# Patient Record
Sex: Male | Born: 1950 | Race: White | Hispanic: No | Marital: Married | Smoking: Never smoker
Health system: Southern US, Community
[De-identification: ages and names within clinical notes are randomized; demographics above are authoritative.]

## PROBLEM LIST (undated history)

## (undated) DIAGNOSIS — I82409 Acute embolism and thrombosis of unspecified deep veins of unspecified lower extremity: Secondary | ICD-10-CM

## (undated) DIAGNOSIS — I10 Essential (primary) hypertension: Secondary | ICD-10-CM

## (undated) DIAGNOSIS — S83209A Unspecified tear of unspecified meniscus, current injury, unspecified knee, initial encounter: Secondary | ICD-10-CM

## (undated) DIAGNOSIS — D649 Anemia, unspecified: Secondary | ICD-10-CM

## (undated) DIAGNOSIS — K219 Gastro-esophageal reflux disease without esophagitis: Secondary | ICD-10-CM

## (undated) HISTORY — PX: KNEE ARTHROSCOPY: SUR90

## (undated) HISTORY — PX: APPENDECTOMY: SHX54

## (undated) HISTORY — PX: HERNIA REPAIR: SHX51

## (undated) HISTORY — PX: CARPAL TUNNEL RELEASE: SHX101

---

## 2000-01-29 ENCOUNTER — Other Ambulatory Visit: Admission: RE | Admit: 2000-01-29 | Discharge: 2000-01-29 | Payer: Self-pay | Admitting: *Deleted

## 2004-06-23 ENCOUNTER — Ambulatory Visit (HOSPITAL_COMMUNITY): Admission: RE | Admit: 2004-06-23 | Discharge: 2004-06-23 | Payer: Self-pay | Admitting: Gastroenterology

## 2014-01-19 ENCOUNTER — Ambulatory Visit
Admission: RE | Admit: 2014-01-19 | Discharge: 2014-01-19 | Disposition: A | Discharge status: Home / Self Care | Payer: 59 | Source: Ambulatory Visit | Attending: Family Medicine | Admitting: Family Medicine

## 2014-01-19 ENCOUNTER — Other Ambulatory Visit: Payer: Self-pay | Admitting: Family Medicine

## 2014-01-19 DIAGNOSIS — M79671 Pain in right foot: Secondary | ICD-10-CM

## 2014-02-16 ENCOUNTER — Ambulatory Visit (INDEPENDENT_AMBULATORY_CARE_PROVIDER_SITE_OTHER): Payer: 59 | Admitting: Podiatrist

## 2014-02-16 ENCOUNTER — Encounter: Payer: Self-pay | Admitting: Podiatrist

## 2014-02-16 ENCOUNTER — Ambulatory Visit (INDEPENDENT_AMBULATORY_CARE_PROVIDER_SITE_OTHER): Payer: 59

## 2014-02-16 VITALS — BP 185/107 | HR 80 | Resp 18 | Ht 67.0 in | Wt 205.0 lb

## 2014-02-16 DIAGNOSIS — M722 Plantar fascial fibromatosis: Secondary | ICD-10-CM

## 2014-02-16 MED ORDER — MELOXICAM 15 MG PO TABS: 15.0000 mg | ORAL_TABLET | Freq: Every day | ORAL | Status: DC

## 2014-02-16 NOTE — Progress Notes (Signed)
   Subjective:    Patient ID: Bobby Conrad, male    DOB: 08-18-1951, 63 y.o.   MRN: [id number]  HPI Comments: Right heel pain , sore , painful , hurts to walk on it. Its been about 4 months. Antiinflammatory Its been hurting that it is messing up my gait. My knees hurt and i have injections in knees . No real treatment for the heels    Foot Pain      Review of Systems  Musculoskeletal:       Joint pain   All other systems reviewed and are negative.       Objective:   Physical Exam GENERAL APPEARANCE: Alert, conversant. Appropriately groomed. No acute distress.  VASCULAR: Pedal pulses palpable and strong bilateral.  Capillary refill time is immediate to all digits,  Proximal to distal cooling it warm to warm.  Digital hair growth is present bilateral  NEUROLOGIC: sensation is intact epicritically and protectively to 5.07 monofilament at 5/5 sites bilateral.  Light touch is intact bilateral, vibratory sensation intact bilateral, achilles tendon reflex is intact bilateral.  Negative Tinel sign is elicited MUSCULOSKELETAL: Pain on palpation plantar medial aspect right foot  at insertion of plantar fascia on the medial calcaneal tubercle. Inflammation at the insertion of the plantar fascia is present. Rectus foot type is seen. DERMATOLOGIC: skin color, texture, and turger are within normal limits.  No preulcerative lesions are seen, no interdigital maceration noted.  No open lesions present.  Digital nails are asymptomatic.      Assessment & Plan:  Plantar fasciitis  Plan:  Discussed etiology, pathology, conservative vs. Surgical therapies and at this time a plantar fascial injection was recommended.  The patient agreed and a sterile skin prep was applied.  An injection consisting of kenalog and marcaine mixture was infiltrated at the point of maximal tenderness on the right Heel.  The patient tolerated this well and was given instructions for aftercare. meloxicam was written as well  as stretching exercises.  Recheck in 3 weeks

## 2014-02-16 NOTE — Patient Instructions (Addendum)

## 2014-03-16 ENCOUNTER — Ambulatory Visit: Payer: 59 | Admitting: Podiatrist

## 2014-10-12 ENCOUNTER — Other Ambulatory Visit: Payer: Self-pay | Admitting: Gastroenterology

## 2016-06-26 DIAGNOSIS — K922 Gastrointestinal hemorrhage, unspecified: Secondary | ICD-10-CM | POA: Diagnosis not present

## 2016-06-26 DIAGNOSIS — D649 Anemia, unspecified: Secondary | ICD-10-CM | POA: Diagnosis not present

## 2016-07-02 DIAGNOSIS — K922 Gastrointestinal hemorrhage, unspecified: Secondary | ICD-10-CM | POA: Diagnosis not present

## 2016-07-07 DIAGNOSIS — D500 Iron deficiency anemia secondary to blood loss (chronic): Secondary | ICD-10-CM | POA: Diagnosis not present

## 2016-07-15 DIAGNOSIS — K449 Diaphragmatic hernia without obstruction or gangrene: Secondary | ICD-10-CM | POA: Diagnosis not present

## 2016-07-15 DIAGNOSIS — D509 Iron deficiency anemia, unspecified: Secondary | ICD-10-CM | POA: Diagnosis not present

## 2016-07-16 ENCOUNTER — Other Ambulatory Visit: Payer: Self-pay | Admitting: Gastroenterology

## 2016-07-16 DIAGNOSIS — Z539 Procedure and treatment not carried out, unspecified reason: Secondary | ICD-10-CM

## 2016-07-16 DIAGNOSIS — K449 Diaphragmatic hernia without obstruction or gangrene: Secondary | ICD-10-CM

## 2016-07-17 ENCOUNTER — Ambulatory Visit
Admission: RE | Admit: 2016-07-17 | Discharge: 2016-07-17 | Disposition: A | Discharge status: Home / Self Care | Payer: PPO | Source: Ambulatory Visit | Attending: Gastroenterology | Admitting: Gastroenterology

## 2016-07-17 DIAGNOSIS — Z539 Procedure and treatment not carried out, unspecified reason: Secondary | ICD-10-CM

## 2016-07-17 DIAGNOSIS — K449 Diaphragmatic hernia without obstruction or gangrene: Secondary | ICD-10-CM | POA: Diagnosis not present

## 2016-07-24 DIAGNOSIS — Z136 Encounter for screening for cardiovascular disorders: Secondary | ICD-10-CM | POA: Diagnosis not present

## 2016-07-24 DIAGNOSIS — Z79899 Other long term (current) drug therapy: Secondary | ICD-10-CM | POA: Diagnosis not present

## 2016-07-24 DIAGNOSIS — Z125 Encounter for screening for malignant neoplasm of prostate: Secondary | ICD-10-CM | POA: Diagnosis not present

## 2016-07-24 DIAGNOSIS — D509 Iron deficiency anemia, unspecified: Secondary | ICD-10-CM | POA: Diagnosis not present

## 2016-07-24 DIAGNOSIS — R030 Elevated blood-pressure reading, without diagnosis of hypertension: Secondary | ICD-10-CM | POA: Diagnosis not present

## 2016-07-24 DIAGNOSIS — Z0000 Encounter for general adult medical examination without abnormal findings: Secondary | ICD-10-CM | POA: Diagnosis not present

## 2016-07-31 DIAGNOSIS — H524 Presbyopia: Secondary | ICD-10-CM | POA: Diagnosis not present

## 2016-08-05 DIAGNOSIS — D649 Anemia, unspecified: Secondary | ICD-10-CM | POA: Diagnosis not present

## 2016-08-05 DIAGNOSIS — K625 Hemorrhage of anus and rectum: Secondary | ICD-10-CM | POA: Diagnosis not present

## 2016-09-09 DIAGNOSIS — K449 Diaphragmatic hernia without obstruction or gangrene: Secondary | ICD-10-CM | POA: Diagnosis not present

## 2016-09-11 ENCOUNTER — Other Ambulatory Visit: Payer: Self-pay | Admitting: General Surgery

## 2016-09-11 DIAGNOSIS — K449 Diaphragmatic hernia without obstruction or gangrene: Secondary | ICD-10-CM

## 2016-09-18 ENCOUNTER — Other Ambulatory Visit: Payer: PPO

## 2016-11-27 ENCOUNTER — Other Ambulatory Visit: Payer: PPO

## 2016-11-27 ENCOUNTER — Ambulatory Visit
Admission: RE | Admit: 2016-11-27 | Discharge: 2016-11-27 | Disposition: A | Discharge status: Home / Self Care | Payer: PPO | Source: Ambulatory Visit | Attending: General Surgery | Admitting: General Surgery

## 2016-11-27 DIAGNOSIS — K449 Diaphragmatic hernia without obstruction or gangrene: Secondary | ICD-10-CM | POA: Diagnosis not present

## 2016-11-27 DIAGNOSIS — I10 Essential (primary) hypertension: Secondary | ICD-10-CM | POA: Diagnosis not present

## 2016-11-27 DIAGNOSIS — D509 Iron deficiency anemia, unspecified: Secondary | ICD-10-CM | POA: Diagnosis not present

## 2016-11-27 MED ORDER — IOPAMIDOL (ISOVUE-300) INJECTION 61%
100.0000 mL | Freq: Once | INTRAVENOUS | Status: AC | PRN
  Administered 2016-11-27: 100 mL via INTRAVENOUS

## 2016-12-03 DIAGNOSIS — K449 Diaphragmatic hernia without obstruction or gangrene: Secondary | ICD-10-CM | POA: Diagnosis not present

## 2016-12-11 ENCOUNTER — Ambulatory Visit: Payer: Self-pay | Admitting: General Surgery

## 2016-12-11 DIAGNOSIS — D509 Iron deficiency anemia, unspecified: Secondary | ICD-10-CM | POA: Diagnosis not present

## 2016-12-11 DIAGNOSIS — Z23 Encounter for immunization: Secondary | ICD-10-CM | POA: Diagnosis not present

## 2016-12-11 DIAGNOSIS — I10 Essential (primary) hypertension: Secondary | ICD-10-CM | POA: Diagnosis not present

## 2016-12-22 DIAGNOSIS — M25562 Pain in left knee: Secondary | ICD-10-CM | POA: Diagnosis not present

## 2016-12-22 DIAGNOSIS — M25561 Pain in right knee: Secondary | ICD-10-CM | POA: Diagnosis not present

## 2017-01-14 NOTE — Patient Instructions (Addendum)
DEVONDRE ACKERMAN  01/14/2017   Your procedure is scheduled on: 01/19/17  Report to [facility]  Entrance take [place]  elevators to 3rd floor to  [place] at  [place] AM.  Call this number if you have problems the morning of surgery [phone]   Remember: ONLY 1 PERSON MAY GO WITH YOU TO SHORT STAY TO GET  READY MORNING OF [place].  Do not eat food or drink liquids :After Midnight.     Take these medicines the morning of surgery with A SIP OF WATER: none              You may not have any metal on your body including hair pins and              piercings  Do not wear jewelry, lotions, powders or perfumes, deodorant                          Men may shave face and neck.   Do not bring valuables to the hospital. [place].  Contacts, dentures or bridgework may not be worn into surgery.  Leave suitcase in the car. After surgery it may be brought to your room.                  Please read over the following fact sheets you were given: _____________________________________________________________________             [facility] - Preparing for Surgery Before surgery, you can play an important role.  Because skin is not sterile, your skin needs to be as free of germs as possible.  You can reduce the number of germs on your skin by washing with CHG (chlorahexidine gluconate) soap before surgery.  CHG is an antiseptic cleaner which kills germs and bonds with the skin to continue killing germs even after washing. Please DO NOT use if you have an allergy to CHG or antibacterial soaps.  If your skin becomes reddened/irritated stop using the CHG and inform your nurse when you arrive at Short Stay. Do not shave (including legs and underarms) for at least 48 hours prior to the first CHG shower.  You may shave your face/neck. Please follow these instructions carefully:  1.  Shower with CHG Soap the night before  surgery and the  morning of Surgery.  2.  If you choose to wash your hair, wash your hair first as usual with your  normal  shampoo.  3.  After you shampoo, rinse your hair and body thoroughly to remove the  shampoo.                           4.  Use CHG as you would any other liquid soap.  You can apply chg directly  to the skin and wash                       Gently with a scrungie or clean washcloth.  5.  Apply the CHG Soap to your body ONLY FROM THE NECK DOWN.   Do not use on face/ open  Wound or open sores. Avoid contact with eyes, ears mouth and genitals (private parts).                       Wash face,  Genitals (private parts) with your normal soap.             6.  Wash thoroughly, paying special attention to the area where your surgery  will be performed.  7.  Thoroughly rinse your body with warm water from the neck down.  8.  DO NOT shower/wash with your normal soap after using and rinsing off  the CHG Soap.                9.  Pat yourself dry with a clean towel.            10.  Wear clean pajamas.            11.  Place clean sheets on your bed the night of your first shower and do not  sleep with pets. Day of Surgery : Do not apply any lotions/deodorants the morning of surgery.  Please wear clean clothes to the hospital/surgery center.  FAILURE TO FOLLOW THESE INSTRUCTIONS MAY RESULT IN THE CANCELLATION OF YOUR SURGERY PATIENT SIGNATURE_________________________________  NURSE SIGNATURE__________________________________  ________________________________________________________________________   WHAT IS A BLOOD TRANSFUSION? Blood Transfusion Information  A transfusion is the replacement of blood or some of its parts. Blood is made up of multiple cells which provide different functions.  Red blood cells carry oxygen and are used for blood loss replacement.  White blood cells fight against infection.  Platelets control bleeding.  Plasma helps clot  blood.  Other blood products are available for specialized needs, such as hemophilia or other clotting disorders. BEFORE THE TRANSFUSION  Who gives blood for transfusions?   Healthy volunteers who are fully evaluated to make sure their blood is safe. This is blood bank blood. Transfusion therapy is the safest it has ever been in the practice of medicine. Before blood is taken from a donor, a complete history is taken to make sure that person has no history of diseases nor engages in risky social behavior (examples are intravenous drug use or sexual activity with multiple partners). The donor's travel history is screened to minimize risk of transmitting infections, such as malaria. The donated blood is tested for signs of infectious diseases, such as HIV and hepatitis. The blood is then tested to be sure it is compatible with you in order to minimize the chance of a transfusion reaction. If you or a relative donates blood, this is often done in anticipation of surgery and is not appropriate for emergency situations. It takes many days to process the donated blood. RISKS AND COMPLICATIONS Although transfusion therapy is very safe and saves many lives, the main dangers of transfusion include:   Getting an infectious disease.  Developing a transfusion reaction. This is an allergic reaction to something in the blood you were given. Every precaution is taken to prevent this. The decision to have a blood transfusion has been considered carefully by your caregiver before blood is given. Blood is not given unless the benefits outweigh the risks. AFTER THE TRANSFUSION  Right after receiving a blood transfusion, you will usually feel much better and more energetic. This is especially true if your red blood cells have gotten low (anemic). The transfusion raises the level of the red blood cells which carry oxygen, and this usually causes an energy increase.  The nurse administering the transfusion will monitor  you carefully for complications. HOME CARE INSTRUCTIONS  No special instructions are needed after a transfusion. You may find your energy is better. Speak with your caregiver about any limitations on activity for underlying diseases you may have. SEEK MEDICAL CARE IF:   Your condition is not improving after your transfusion.  You develop redness or irritation at the intravenous (IV) site. SEEK IMMEDIATE MEDICAL CARE IF:  Any of the following symptoms occur over the next 12 hours:  Shaking chills.  You have a temperature by mouth above 102 F (38.9 C), not controlled by medicine.  Chest, back, or muscle pain.  People around you feel you are not acting correctly or are confused.  Shortness of breath or difficulty breathing.  Dizziness and fainting.  You get a rash or develop hives.  You have a decrease in urine output.  Your urine turns a dark color or changes to pink, red, or brown. Any of the following symptoms occur over the next 10 days:  You have a temperature by mouth above 102 F (38.9 C), not controlled by medicine.  Shortness of breath.  Weakness after normal activity.  The white part of the eye turns yellow (jaundice).  You have a decrease in the amount of urine or are urinating less often.  Your urine turns a dark color or changes to pink, red, or brown. Document Released: 12/11/2000 Document Revised: 03/07/2012 Document Reviewed: 07/30/2008 [facility] Patient Information 2014 [place], [place].  _______________________________________________________________________

## 2017-01-15 ENCOUNTER — Encounter (INDEPENDENT_AMBULATORY_CARE_PROVIDER_SITE_OTHER): Payer: Self-pay

## 2017-01-15 ENCOUNTER — Encounter (HOSPITAL_COMMUNITY): Payer: Self-pay

## 2017-01-15 ENCOUNTER — Encounter (HOSPITAL_COMMUNITY)
Admission: RE | Admit: 2017-01-15 | Discharge: 2017-01-15 | Disposition: A | Discharge status: Home / Self Care | Payer: PPO | Source: Ambulatory Visit | Attending: General Surgery | Admitting: General Surgery

## 2017-01-15 DIAGNOSIS — Z01818 Encounter for other preprocedural examination: Secondary | ICD-10-CM | POA: Insufficient documentation

## 2017-01-15 DIAGNOSIS — I10 Essential (primary) hypertension: Secondary | ICD-10-CM | POA: Diagnosis not present

## 2017-01-15 HISTORY — DX: Anemia, unspecified: D64.9

## 2017-01-15 HISTORY — DX: Unspecified tear of unspecified meniscus, current injury, unspecified knee, initial encounter: S83.209A

## 2017-01-15 LAB — COMPREHENSIVE METABOLIC PANEL
ALBUMIN: 4.3 g/dL (ref 3.5–5.0)
ALT: 22 U/L (ref 17–63)
AST: 23 U/L (ref 15–41)
Alkaline Phosphatase: 90 U/L (ref 38–126)
Anion gap: 8 (ref 5–15)
BILIRUBIN TOTAL: 0.6 mg/dL (ref 0.3–1.2)
BUN: 15 mg/dL (ref 6–20)
CO2: 29 mmol/L (ref 22–32)
Calcium: 8.9 mg/dL (ref 8.9–10.3)
Chloride: 102 mmol/L (ref 101–111)
Creatinine, Ser: 0.76 mg/dL (ref 0.61–1.24)
GFR calc Af Amer: >60 mL/min (ref >60)
GFR calc non Af Amer: >60 mL/min (ref >60)
GLUCOSE: 95 mg/dL (ref 65–99)
POTASSIUM: 3.3 mmol/L — AB (ref 3.5–5.1)
Sodium: 139 mmol/L (ref 135–145)
TOTAL PROTEIN: 7.2 g/dL (ref 6.5–8.1)

## 2017-01-15 LAB — CBC WITH DIFFERENTIAL/PLATELET
BASOS ABS: 0.1 10*3/uL (ref 0.0–0.1)
BASOS PCT: 1 %
Eosinophils Absolute: 0.1 10*3/uL (ref 0.0–0.7)
Eosinophils Relative: 3 %
HEMATOCRIT: 42 % (ref 39.0–52.0)
HEMOGLOBIN: 14.6 g/dL (ref 13.0–17.0)
Lymphocytes Relative: 32 %
Lymphs Abs: 1.7 10*3/uL (ref 0.7–4.0)
MCH: 30.4 pg (ref 26.0–34.0)
MCHC: 34.8 g/dL (ref 30.0–36.0)
MCV: 87.5 fL (ref 78.0–100.0)
MONO ABS: 0.4 10*3/uL (ref 0.1–1.0)
Monocytes Relative: 8 %
NEUTROS ABS: 3 10*3/uL (ref 1.7–7.7)
NEUTROS PCT: 56 %
Platelets: 273 10*3/uL (ref 150–400)
RBC: 4.8 MIL/uL (ref 4.22–5.81)
RDW: 12.2 % (ref 11.5–15.5)
WBC: 5.3 10*3/uL (ref 4.0–10.5)

## 2017-01-15 LAB — ABO/RH: ABO/RH(D): O POS

## 2017-01-15 NOTE — Progress Notes (Signed)
   01/15/17 0901  OBSTRUCTIVE SLEEP APNEA  Have you ever been diagnosed with sleep apnea through a sleep study? No  Do you snore loudly (loud enough to be heard through closed doors)?  0  Do you often feel tired, fatigued, or sleepy during the daytime (such as falling asleep during driving or talking to someone)? 0  Has anyone observed you stop breathing during your sleep? 0  Do you have, or are you being treated for high blood pressure? 1  BMI more than 35 kg/m2? 1  Age > 50 (1-yes) 1  Neck circumference greater than:Male 16 inches or larger, Male 17inches or larger? 1  Male Gender (Yes=1) 1  Obstructive Sleep Apnea Score 5

## 2017-01-15 NOTE — Pre-Procedure Instructions (Signed)
Pt states his PCP prescribed HCTZ because he has "white coat syndrome" and the doctor wanted to "test this out."

## 2017-01-19 ENCOUNTER — Encounter (HOSPITAL_COMMUNITY): Payer: Self-pay | Admitting: *Deleted

## 2017-01-19 ENCOUNTER — Observation Stay (HOSPITAL_COMMUNITY)
Admission: RE | Admit: 2017-01-19 | Discharge: 2017-01-21 | Disposition: A | Discharge status: Home / Self Care | Payer: PPO | Source: Ambulatory Visit | Attending: General Surgery | Admitting: General Surgery

## 2017-01-19 ENCOUNTER — Encounter (HOSPITAL_COMMUNITY)
Admission: RE | Disposition: A | Discharge status: Home / Self Care | Payer: Self-pay | Source: Ambulatory Visit | Attending: General Surgery

## 2017-01-19 ENCOUNTER — Ambulatory Visit (HOSPITAL_COMMUNITY): Payer: PPO | Admitting: Certified Registered Nurse Anesthetist

## 2017-01-19 DIAGNOSIS — K440 Diaphragmatic hernia with obstruction, without gangrene: Secondary | ICD-10-CM | POA: Diagnosis not present

## 2017-01-19 DIAGNOSIS — Z79899 Other long term (current) drug therapy: Secondary | ICD-10-CM | POA: Diagnosis not present

## 2017-01-19 DIAGNOSIS — I10 Essential (primary) hypertension: Secondary | ICD-10-CM | POA: Insufficient documentation

## 2017-01-19 DIAGNOSIS — K449 Diaphragmatic hernia without obstruction or gangrene: Secondary | ICD-10-CM | POA: Diagnosis not present

## 2017-01-19 DIAGNOSIS — Z6833 Body mass index (BMI) 33.0-33.9, adult: Secondary | ICD-10-CM | POA: Insufficient documentation

## 2017-01-19 DIAGNOSIS — R110 Nausea: Secondary | ICD-10-CM

## 2017-01-19 HISTORY — PX: LAPAROSCOPIC NISSEN FUNDOPLICATION: SHX1932

## 2017-01-19 LAB — TYPE AND SCREEN
ABO/RH(D): O POS
Antibody Screen: NEGATIVE

## 2017-01-19 SURGERY — REPAIR, HERNIA, PARAESOPHAGEAL, LAPAROSCOPIC
Anesthesia: General | Site: Abdomen

## 2017-01-19 MED ORDER — SUGAMMADEX SODIUM 200 MG/2ML IV SOLN
INTRAVENOUS | Status: AC
  Filled 2017-01-19: qty 2

## 2017-01-19 MED ORDER — PHENYLEPHRINE 40 MCG/ML (10ML) SYRINGE FOR IV PUSH (FOR BLOOD PRESSURE SUPPORT)
PREFILLED_SYRINGE | INTRAVENOUS | Status: AC
  Filled 2017-01-19: qty 10

## 2017-01-19 MED ORDER — SODIUM CHLORIDE 0.9 % IJ SOLN
INTRAMUSCULAR | Status: AC
  Filled 2017-01-19: qty 50

## 2017-01-19 MED ORDER — PROPOFOL 10 MG/ML IV BOLUS
INTRAVENOUS | Status: AC
  Filled 2017-01-19: qty 20

## 2017-01-19 MED ORDER — FENTANYL CITRATE (PF) 250 MCG/5ML IJ SOLN
INTRAMUSCULAR | Status: AC
  Filled 2017-01-19: qty 5

## 2017-01-19 MED ORDER — ROCURONIUM BROMIDE 50 MG/5ML IV SOSY
PREFILLED_SYRINGE | INTRAVENOUS | Status: AC
  Filled 2017-01-19: qty 5

## 2017-01-19 MED ORDER — DIPHENHYDRAMINE HCL 12.5 MG/5ML PO ELIX: 12.5000 mg | ORAL_SOLUTION | Freq: Four times a day (QID) | ORAL | Status: DC | PRN

## 2017-01-19 MED ORDER — CEFOTETAN DISODIUM-DEXTROSE 2-2.08 GM-% IV SOLR
2.0000 g | INTRAVENOUS | Status: AC
  Administered 2017-01-19: 2 g via INTRAVENOUS

## 2017-01-19 MED ORDER — DEXAMETHASONE SODIUM PHOSPHATE 10 MG/ML IJ SOLN
INTRAMUSCULAR | Status: DC | PRN
Start: 2017-01-19 — End: 2017-01-19
  Administered 2017-01-19: 10 mg via INTRAVENOUS

## 2017-01-19 MED ORDER — MORPHINE SULFATE (PF) 2 MG/ML IV SOLN
1.0000 mg | INTRAVENOUS | Status: DC | PRN
  Administered 2017-01-20 (×2): 2 mg via INTRAVENOUS
  Administered 2017-01-20: 3 mg via INTRAVENOUS
  Administered 2017-01-20 – 2017-01-21 (×6): 2 mg via INTRAVENOUS
  Filled 2017-01-19 (×3): qty 1
  Filled 2017-01-19: qty 2
  Filled 2017-01-19 (×6): qty 1

## 2017-01-19 MED ORDER — MIDAZOLAM HCL 2 MG/2ML IJ SOLN
INTRAMUSCULAR | Status: AC
  Filled 2017-01-19: qty 2

## 2017-01-19 MED ORDER — HYDROMORPHONE HCL 1 MG/ML IJ SOLN
INTRAMUSCULAR | Status: AC
  Filled 2017-01-19: qty 1

## 2017-01-19 MED ORDER — EPHEDRINE SULFATE-NACL 50-0.9 MG/10ML-% IV SOSY
PREFILLED_SYRINGE | INTRAVENOUS | Status: DC | PRN
  Administered 2017-01-19 (×3): 10 mg via INTRAVENOUS

## 2017-01-19 MED ORDER — FENTANYL CITRATE (PF) 100 MCG/2ML IJ SOLN
INTRAMUSCULAR | Status: AC
  Filled 2017-01-19: qty 2

## 2017-01-19 MED ORDER — GABAPENTIN 300 MG PO CAPS
300.0000 mg | ORAL_CAPSULE | ORAL | Status: AC
  Administered 2017-01-19: 300 mg via ORAL
  Filled 2017-01-19: qty 1

## 2017-01-19 MED ORDER — LACTATED RINGERS IV SOLN
INTRAVENOUS | Status: DC | PRN
  Administered 2017-01-19 (×3): via INTRAVENOUS

## 2017-01-19 MED ORDER — LACTATED RINGERS IR SOLN
Status: DC | PRN
  Administered 2017-01-19: 1000 mL

## 2017-01-19 MED ORDER — METOCLOPRAMIDE HCL 5 MG/ML IJ SOLN: 10.0000 mg | Freq: Three times a day (TID) | INTRAMUSCULAR | Status: DC | PRN

## 2017-01-19 MED ORDER — MEPERIDINE HCL 50 MG/ML IJ SOLN: 6.2500 mg | INTRAMUSCULAR | Status: DC | PRN

## 2017-01-19 MED ORDER — PHENYLEPHRINE 40 MCG/ML (10ML) SYRINGE FOR IV PUSH (FOR BLOOD PRESSURE SUPPORT)
PREFILLED_SYRINGE | INTRAVENOUS | Status: AC
  Filled 2017-01-19: qty 20

## 2017-01-19 MED ORDER — LIDOCAINE 2% (20 MG/ML) 5 ML SYRINGE
INTRAMUSCULAR | Status: DC | PRN
  Administered 2017-01-19: 100 mg via INTRAVENOUS

## 2017-01-19 MED ORDER — ONDANSETRON HCL 4 MG/2ML IJ SOLN
INTRAMUSCULAR | Status: AC
  Filled 2017-01-19: qty 2

## 2017-01-19 MED ORDER — FENTANYL CITRATE (PF) 100 MCG/2ML IJ SOLN
INTRAMUSCULAR | Status: DC | PRN
  Administered 2017-01-19: 100 ug via INTRAVENOUS
  Administered 2017-01-19 (×4): 50 ug via INTRAVENOUS

## 2017-01-19 MED ORDER — ENALAPRILAT 1.25 MG/ML IV SOLN
1.2500 mg | Freq: Four times a day (QID) | INTRAVENOUS | Status: DC | PRN
  Administered 2017-01-19 – 2017-01-21 (×4): 1.25 mg via INTRAVENOUS
  Filled 2017-01-19 (×6): qty 1

## 2017-01-19 MED ORDER — PROMETHAZINE HCL 25 MG/ML IJ SOLN: 12.5000 mg | Freq: Four times a day (QID) | INTRAMUSCULAR | Status: DC | PRN

## 2017-01-19 MED ORDER — MIDAZOLAM HCL 5 MG/5ML IJ SOLN
INTRAMUSCULAR | Status: DC | PRN
  Administered 2017-01-19: 2 mg via INTRAVENOUS

## 2017-01-19 MED ORDER — ONDANSETRON HCL 4 MG/2ML IJ SOLN
4.0000 mg | INTRAMUSCULAR | Status: DC
Start: 2017-01-19 — End: 2017-01-21
  Administered 2017-01-19 – 2017-01-21 (×12): 4 mg via INTRAVENOUS
  Filled 2017-01-19 (×12): qty 2

## 2017-01-19 MED ORDER — LABETALOL HCL 5 MG/ML IV SOLN
5.0000 mg | INTRAVENOUS | Status: DC | PRN
  Administered 2017-01-19: 5 mg via INTRAVENOUS

## 2017-01-19 MED ORDER — HYDROMORPHONE HCL 1 MG/ML IJ SOLN
0.2500 mg | INTRAMUSCULAR | Status: DC | PRN
  Administered 2017-01-19 (×4): 0.5 mg via INTRAVENOUS

## 2017-01-19 MED ORDER — PANTOPRAZOLE SODIUM 40 MG IV SOLR
40.0000 mg | Freq: Every day | INTRAVENOUS | Status: DC
  Administered 2017-01-19 – 2017-01-20 (×2): 40 mg via INTRAVENOUS
  Filled 2017-01-19 (×2): qty 40

## 2017-01-19 MED ORDER — KCL IN DEXTROSE-NACL 20-5-0.45 MEQ/L-%-% IV SOLN
INTRAVENOUS | Status: DC
  Administered 2017-01-19: 100 mL/h via INTRAVENOUS
  Administered 2017-01-20 (×3): via INTRAVENOUS
  Filled 2017-01-19 (×6): qty 1000

## 2017-01-19 MED ORDER — PROPOFOL 10 MG/ML IV BOLUS
INTRAVENOUS | Status: DC | PRN
  Administered 2017-01-19: 200 mg via INTRAVENOUS

## 2017-01-19 MED ORDER — LABETALOL HCL 5 MG/ML IV SOLN
INTRAVENOUS | Status: AC
  Filled 2017-01-19: qty 4

## 2017-01-19 MED ORDER — ROCURONIUM BROMIDE 10 MG/ML (PF) SYRINGE
PREFILLED_SYRINGE | INTRAVENOUS | Status: DC | PRN
  Administered 2017-01-19 (×9): 10 mg via INTRAVENOUS
  Administered 2017-01-19: 50 mg via INTRAVENOUS

## 2017-01-19 MED ORDER — ROCURONIUM BROMIDE 50 MG/5ML IV SOSY
PREFILLED_SYRINGE | INTRAVENOUS | Status: AC
  Filled 2017-01-19: qty 10

## 2017-01-19 MED ORDER — 0.9 % SODIUM CHLORIDE (POUR BTL) OPTIME
TOPICAL | Status: DC | PRN
  Administered 2017-01-19: 1000 mL

## 2017-01-19 MED ORDER — CEFOTETAN DISODIUM-DEXTROSE 2-2.08 GM-% IV SOLR
INTRAVENOUS | Status: AC
  Filled 2017-01-19: qty 50

## 2017-01-19 MED ORDER — LIDOCAINE 2% (20 MG/ML) 5 ML SYRINGE
INTRAMUSCULAR | Status: AC
  Filled 2017-01-19: qty 5

## 2017-01-19 MED ORDER — METHOCARBAMOL 1000 MG/10ML IJ SOLN
500.0000 mg | Freq: Three times a day (TID) | INTRAVENOUS | Status: DC | PRN
  Administered 2017-01-20 – 2017-01-21 (×2): 500 mg via INTRAVENOUS
  Filled 2017-01-19: qty 5
  Filled 2017-01-19: qty 550
  Filled 2017-01-19: qty 5
  Filled 2017-01-19: qty 550

## 2017-01-19 MED ORDER — SODIUM CHLORIDE 0.9 % IJ SOLN
INTRAMUSCULAR | Status: DC | PRN
  Administered 2017-01-19: 50 mL

## 2017-01-19 MED ORDER — ENOXAPARIN SODIUM 40 MG/0.4ML ~~LOC~~ SOLN
40.0000 mg | SUBCUTANEOUS | Status: DC
  Administered 2017-01-20 – 2017-01-21 (×2): 40 mg via SUBCUTANEOUS
  Filled 2017-01-19 (×2): qty 0.4

## 2017-01-19 MED ORDER — DIPHENHYDRAMINE HCL 50 MG/ML IJ SOLN: 12.5000 mg | Freq: Four times a day (QID) | INTRAMUSCULAR | Status: DC | PRN

## 2017-01-19 MED ORDER — BUPIVACAINE LIPOSOME 1.3 % IJ SUSP
20.0000 mL | Freq: Once | INTRAMUSCULAR | Status: AC
  Administered 2017-01-19: 20 mL
  Filled 2017-01-19: qty 20

## 2017-01-19 MED ORDER — PHENYLEPHRINE 40 MCG/ML (10ML) SYRINGE FOR IV PUSH (FOR BLOOD PRESSURE SUPPORT)
PREFILLED_SYRINGE | INTRAVENOUS | Status: DC | PRN
  Administered 2017-01-19 (×3): 80 ug via INTRAVENOUS
  Administered 2017-01-19 (×2): 40 ug via INTRAVENOUS
  Administered 2017-01-19 (×4): 80 ug via INTRAVENOUS
  Administered 2017-01-19: 40 ug via INTRAVENOUS
  Administered 2017-01-19 (×6): 80 ug via INTRAVENOUS
  Administered 2017-01-19: 40 ug via INTRAVENOUS

## 2017-01-19 MED ORDER — PROMETHAZINE HCL 25 MG/ML IJ SOLN: 6.2500 mg | INTRAMUSCULAR | Status: DC | PRN

## 2017-01-19 MED ORDER — LACTATED RINGERS IV SOLN
INTRAVENOUS | Status: DC
  Administered 2017-01-19: 1000 mL via INTRAVENOUS

## 2017-01-19 MED ORDER — HEPARIN SODIUM (PORCINE) 5000 UNIT/ML IJ SOLN
5000.0000 [IU] | Freq: Once | INTRAMUSCULAR | Status: AC
  Administered 2017-01-19: 5000 [IU] via SUBCUTANEOUS
  Filled 2017-01-19: qty 1

## 2017-01-19 MED ORDER — GLYCOPYRROLATE 0.2 MG/ML IJ SOLN
INTRAMUSCULAR | Status: DC | PRN
  Administered 2017-01-19: 0.2 mg via INTRAVENOUS

## 2017-01-19 MED ORDER — SUCCINYLCHOLINE CHLORIDE 200 MG/10ML IV SOSY
PREFILLED_SYRINGE | INTRAVENOUS | Status: DC | PRN
  Administered 2017-01-19: 140 mg via INTRAVENOUS

## 2017-01-19 MED ORDER — ACETAMINOPHEN 500 MG PO TABS
1000.0000 mg | ORAL_TABLET | ORAL | Status: AC
  Administered 2017-01-19: 1000 mg via ORAL
  Filled 2017-01-19: qty 2

## 2017-01-19 MED ORDER — MIDAZOLAM HCL 2 MG/2ML IJ SOLN: 0.5000 mg | Freq: Once | INTRAMUSCULAR | Status: DC | PRN

## 2017-01-19 MED ORDER — SUGAMMADEX SODIUM 200 MG/2ML IV SOLN
INTRAVENOUS | Status: DC | PRN
  Administered 2017-01-19: 200 mg via INTRAVENOUS

## 2017-01-19 SURGICAL SUPPLY — 83 items
ADH SKN CLS APL DERMABOND .7 (GAUZE/BANDAGES/DRESSINGS) ×2
APL SKNCLS STERI-STRIP NONHPOA (GAUZE/BANDAGES/DRESSINGS) ×2
APPLIER CLIP 5 13 M/L LIGAMAX5 (MISCELLANEOUS)
APR CLP MED LRG 5 ANG JAW (MISCELLANEOUS)
BAG SPEC RTRVL LRG 6X4 10 (ENDOMECHANICALS) ×2
BAG URINE DRAINAGE (UROLOGICAL SUPPLIES) ×1 IMPLANT
BENZOIN TINCTURE PRP APPL 2/3 (GAUZE/BANDAGES/DRESSINGS) ×4 IMPLANT
BLADE HEX COATED 2.75 (ELECTRODE) IMPLANT
CHLORAPREP W/TINT 26ML (MISCELLANEOUS) ×4 IMPLANT
CLIP APPLIE 5 13 M/L LIGAMAX5 (MISCELLANEOUS) IMPLANT
COVER SURGICAL LIGHT HANDLE (MISCELLANEOUS) ×8 IMPLANT
DECANTER SPIKE VIAL GLASS SM (MISCELLANEOUS) ×4 IMPLANT
DERMABOND ADVANCED (GAUZE/BANDAGES/DRESSINGS) ×2
DERMABOND ADVANCED .7 DNX12 (GAUZE/BANDAGES/DRESSINGS) ×1 IMPLANT
DEVICE PMI PUNCTURE CLOSURE (MISCELLANEOUS) ×3 IMPLANT
DEVICE SUT QUICK LOAD TK 5 (STAPLE) ×12 IMPLANT
DEVICE SUT TI-KNOT TK 5X26 (MISCELLANEOUS) ×2 IMPLANT
DEVICE SUTURE ENDOST 10MM (ENDOMECHANICALS) ×3 IMPLANT
DEVICE TI KNOT TK5 (MISCELLANEOUS) ×1
DISSECTOR BLUNT TIP ENDO 5MM (MISCELLANEOUS) ×4 IMPLANT
DRAIN PENROSE 18X1/2 LTX STRL (DRAIN) ×4 IMPLANT
DRAPE LAPAROSCOPIC ABDOMINAL (DRAPES) ×1 IMPLANT
DRAPE WARM FLUID 44X44 (DRAPE) ×1 IMPLANT
ELECT PENCIL ROCKER SW 15FT (MISCELLANEOUS) ×3 IMPLANT
ELECT REM PT RETURN 9FT ADLT (ELECTROSURGICAL) ×4
ELECTRODE REM PT RTRN 9FT ADLT (ELECTROSURGICAL) ×2 IMPLANT
FELT TEFLON 4 X1 (Mesh General) ×3 IMPLANT
FILTER SMOKE EVAC LAPAROSHD (FILTER) ×3 IMPLANT
GAUZE SPONGE 4X4 12PLY STRL (GAUZE/BANDAGES/DRESSINGS) IMPLANT
GLOVE BIO SURGEON STRL SZ7.5 (GLOVE) ×4 IMPLANT
GLOVE BIOGEL M STRL SZ7.5 (GLOVE) IMPLANT
GLOVE BIOGEL PI IND STRL 7.0 (GLOVE) ×2 IMPLANT
GLOVE BIOGEL PI INDICATOR 7.0 (GLOVE) ×2
GLOVE INDICATOR 8.0 STRL GRN (GLOVE) IMPLANT
GOWN STRL REUS W/TWL LRG LVL3 (GOWN DISPOSABLE) ×4 IMPLANT
GOWN STRL REUS W/TWL XL LVL3 (GOWN DISPOSABLE) ×8 IMPLANT
HANDLE SUCTION POOLE (INSTRUMENTS) IMPLANT
IRRIG SUCT STRYKERFLOW 2 WTIP (MISCELLANEOUS)
IRRIGATION SUCT STRKRFLW 2 WTP (MISCELLANEOUS) ×1 IMPLANT
KIT BASIN OR (CUSTOM PROCEDURE TRAY) ×4 IMPLANT
NEEDLE HYPO 22GX1.5 SAFETY (NEEDLE) IMPLANT
NS IRRIG 1000ML POUR BTL (IV SOLUTION) ×4 IMPLANT
PACK GENERAL/GYN (CUSTOM PROCEDURE TRAY) ×4 IMPLANT
PAD POSITIONING PINK XL (MISCELLANEOUS) ×3 IMPLANT
POSITIONER SURGICAL ARM (MISCELLANEOUS) IMPLANT
POUCH SPECIMEN RETRIEVAL 10MM (ENDOMECHANICALS) ×3 IMPLANT
QUICK LOAD TK 5 (STAPLE) ×6
SCISSORS LAP 5X35 DISP (ENDOMECHANICALS) ×4 IMPLANT
SHEARS CURVED HARMONIC AC 45CM (MISCELLANEOUS) ×3 IMPLANT
SHEARS HARMONIC ACE PLUS 36CM (ENDOMECHANICALS) ×4 IMPLANT
SLEEVE ADV FIXATION 5X100MM (TROCAR) ×6 IMPLANT
SOLUTION ANTI FOG 6CC (MISCELLANEOUS) ×4 IMPLANT
SPONGE DRAIN TRACH 4X4 STRL 2S (GAUZE/BANDAGES/DRESSINGS) ×1 IMPLANT
SPONGE LAP 18X18 X RAY DECT (DISPOSABLE) IMPLANT
STAPLER VISISTAT 35W (STAPLE) ×3 IMPLANT
SUCTION POOLE HANDLE (INSTRUMENTS)
SUT DEVICE BRAIDED 0X39 (SUTURE) ×6 IMPLANT
SUT ETHIBOND 0 36 GRN (SUTURE) ×3 IMPLANT
SUT ETHIBOND 2 0 SH (SUTURE)
SUT ETHIBOND 2 0 SH 36X2 (SUTURE) IMPLANT
SUT ETHILON 2 0 PS N (SUTURE) ×1 IMPLANT
SUT MNCRL AB 4-0 PS2 18 (SUTURE) ×4 IMPLANT
SUT PDS AB 1 CT1 27 (SUTURE) IMPLANT
SUT PROLENE 2 0 BLUE (SUTURE) IMPLANT
SUT SURGIDAC NAB ES-9 0 48 120 (SUTURE) ×15 IMPLANT
SUT VIC AB 0 UR5 27 (SUTURE) IMPLANT
SUT VIC AB 4-0 SH 18 (SUTURE) ×4 IMPLANT
SYR 20CC LL (SYRINGE) ×4 IMPLANT
SYR 30ML LL (SYRINGE) ×4 IMPLANT
TAPE CLOTH 4X10 WHT NS (GAUZE/BANDAGES/DRESSINGS) IMPLANT
TIP INNERVISION DETACH 50FR (MISCELLANEOUS) ×3 IMPLANT
TIP INNERVISION DETACH 56FR (MISCELLANEOUS) ×3 IMPLANT
TOWEL OR 17X26 10 PK STRL BLUE (TOWEL DISPOSABLE) ×4 IMPLANT
TOWEL OR NON WOVEN STRL DISP B (DISPOSABLE) ×4 IMPLANT
TRAY FOLEY W/METER SILVER 16FR (SET/KITS/TRAYS/PACK) ×4 IMPLANT
TRAY LAPAROSCOPIC (CUSTOM PROCEDURE TRAY) ×4 IMPLANT
TROCAR ADV FIXATION 11X100MM (TROCAR) ×3 IMPLANT
TROCAR ADV FIXATION 12X100MM (TROCAR) ×3 IMPLANT
TROCAR ADV FIXATION 5X100MM (TROCAR) ×3 IMPLANT
TROCAR BLADELESS OPT 5 100 (ENDOMECHANICALS) ×3 IMPLANT
TROCAR XCEL NON-BLD 11X100MML (ENDOMECHANICALS) ×1 IMPLANT
TROCAR XCEL UNIV SLVE 11M 100M (ENDOMECHANICALS) ×3 IMPLANT
TUBING INSUF HEATED (TUBING) ×4 IMPLANT

## 2017-01-19 NOTE — Anesthesia Procedure Notes (Signed)
Procedure Name: Intubation Date/Time: 01/19/2017 7:44 AM Performed by: Montel Clock Pre-anesthesia Checklist: Patient identified, Emergency Drugs available, Suction available, Patient being monitored and Timeout performed Patient Re-evaluated:Patient Re-evaluated prior to inductionOxygen Delivery Method: Circle system utilized Preoxygenation: Pre-oxygenation with 100% oxygen Intubation Type: IV induction, Cricoid Pressure applied and Rapid sequence Laryngoscope Size: 3 and Glidescope Grade View: Grade I Tube type: Oral Tube size: 7.5 mm Number of attempts: 1 Airway Equipment and Method: Rigid stylet Placement Confirmation: ETT inserted through vocal cords under direct vision,  positive ETCO2 and breath sounds checked- equal and bilateral Secured at: 23 cm Tube secured with: Tape Dental Injury: Teeth and Oropharynx as per pre-operative assessment  Comments: Elective glidescope, anterior appearing airway.

## 2017-01-19 NOTE — Brief Op Note (Signed)
01/19/2017  12:44 PM  PATIENT:  Bobby Conrad  66 y.o. male  PRE-OPERATIVE DIAGNOSIS:  LARGE PARAESOPHAGEAL HERNIA  POST-OPERATIVE DIAGNOSIS:  LARGE PARAESOPHAGEAL HERNIA  PROCEDURE:  Procedure(s): LAPAROSCOPIC PARAESOPHAGEAL HERNIA REPAIR (N/A) LAPAROSCOPIC NISSEN FUNDOPLICATION (N/A)  SURGEON:  Surgeon(s) and Role:    * Greer Pickerel, MD - Primary    * Jackolyn Confer, MD - Assisting  PHYSICIAN ASSISTANT:   ASSISTANTS: Jackolyn Confer, MD   ANESTHESIA:   general  EBL:  Total I/O In: 2800 [I.V.:2800] Out: 200 [Urine:150; Blood:50]  BLOOD ADMINISTERED:none  DRAINS: Urinary Catheter (Foley)   LOCAL MEDICATIONS USED:  OTHER exparel  SPECIMEN:  Source of Specimen:  hernia sac in pieces  DISPOSITION OF SPECIMEN:  PATHOLOGY  COUNTS:  YES  TOURNIQUET:  * No tourniquets in log *  DICTATION: .Other Dictation: Dictation Number [id number]  PLAN OF CARE: Admit for overnight observation  PATIENT DISPOSITION:  PACU - hemodynamically stable.   Delay start of Pharmacological VTE agent (>24hrs) due to surgical blood loss or risk of bleeding: no  Leighton Ruff. Redmond Pulling, MD, FACS General, Bariatric, & Minimally Invasive Surgery [facility], [place]

## 2017-01-19 NOTE — Progress Notes (Signed)
Nurse tech reported to RN about patient's high blood pressure. Diastolic has been consistently higher than 90. Patient then reported having swelling in his right hand (hand with no IV), tingling, and decreased strength. Bedside RN completed a stroke assessment with the only deficit noticed being decreased strength in the right hand. RN sent pharmacy a request for the PRN antihypertensive and paged Rapid Response RN, Pam, for a second set of eyes.   PRN dose of enalaprilat given and RR RN assessed. Patient stable at this time. Told patient to call if any other symptoms arise. Will continue to monitor patient.

## 2017-01-19 NOTE — Significant Event (Signed)
Rapid Response Event Note  Overview:      Initial Focused Assessment:   Interventions:  Plan of Care (if not transferred): Watch/observation, call Md and RRT if needed further  Event Summary: Bedside RN called RRT RN to 1527 for observation of patient. Concerned that right hand numb and tingling, and not acting quite right. Upon arrival patient supine in bed. Wife sitting in chair beside bed. OR today for Nissen fundoplication. Patient able to answer questions and follow commands. Touch sensation was the same on both sides of his face. Grips were equal, foot pushes equal. Able to smile symmetrically and stick tongue out, raise eyebrows all symmetrically. Able to maintain arm and leg balances.  Patient states his right hand is swollen and tingling up to shoulder area and his neck is sore on both sides since OR. This RN asked patient's wife if the patient was acting normally to her, if this was his usual speech pattern and use of words. The wife stated he was not acting exactly like normal. The patient spoke up and said he still felt some sedation on board. He states what was meant to be a 2 hour surgery turned into a 4 hour surgery. This RN reassured patient that it was probable positioning during OR but if anything changes or he feels uncomfortable to notify his RN. Bedside RN reassured to call RRT RN if further needed. BP 163/102 (122), HR 80's NSR, RR 18, Sats 100% on RA.   at      at          [place], [place]

## 2017-01-19 NOTE — Transfer of Care (Signed)
Immediate Anesthesia Transfer of Care Note  Patient: Bobby Conrad  Procedure(s) Performed: Procedure(s): LAPAROSCOPIC PARAESOPHAGEAL HERNIA REPAIR (N/A) LAPAROSCOPIC NISSEN FUNDOPLICATION (N/A)  Patient Location: PACU  Anesthesia Type:General  Level of Consciousness:  sedated, patient cooperative and responds to stimulation  Airway & Oxygen Therapy:Patient Spontanous Breathing and Patient connected to face mask oxgen  Post-op Assessment:  Report given to PACU RN and Post -op Vital signs reviewed and stable  Post vital signs:  Reviewed and stable  Last Vitals:  Vitals:   01/19/17 0500 01/19/17 0555  BP: (!) 184/110 (!) 159/109  Pulse: 62   Resp: 16   Temp: [id number] C     Complications: No apparent anesthesia complications

## 2017-01-19 NOTE — Anesthesia Preprocedure Evaluation (Addendum)
Anesthesia Evaluation  Patient identified by MRN, date of birth, ID band Patient awake    Reviewed: Allergy & Precautions, NPO status , Patient's Chart, lab work & pertinent test results  History of Anesthesia Complications Negative for: history of anesthetic complications  Airway Mallampati: IV  TM Distance: >3 FB Neck ROM: Full    Dental  (+) Dental Advisory Given   Pulmonary neg pulmonary ROS,    Pulmonary exam normal        Cardiovascular hypertension, negative cardio ROS   Rhythm:Regular Rate:Normal     Neuro/Psych negative neurological ROS     GI/Hepatic Neg liver ROS, hiatal hernia,   Endo/Other  Morbid obesity  Renal/GU negative Renal ROS     Musculoskeletal   Abdominal (+) + obese,   Peds  Hematology negative hematology ROS (+)   Anesthesia Other Findings   Reproductive/Obstetrics                            Anesthesia Physical Anesthesia Plan  ASA: II  Anesthesia Plan: General   Post-op Pain Management:    Induction: Intravenous  Airway Management Planned: Oral ETT and Video Laryngoscope Planned  Additional Equipment:   Intra-op Plan:   Post-operative Plan: Extubation in OR  Informed Consent: I have reviewed the patients History and Physical, chart, labs and discussed the procedure including the risks, benefits and alternatives for the proposed anesthesia with the patient or authorized representative who has indicated his/her understanding and acceptance.   Dental advisory given  Plan Discussed with: CRNA and Surgeon  Anesthesia Plan Comments: (Plan routine monitors, GETA with VideoGlide intubation)        Anesthesia Quick Evaluation

## 2017-01-19 NOTE — Anesthesia Postprocedure Evaluation (Signed)
Anesthesia Post Note  Patient: MARCELLA BRASSARD  Procedure(s) Performed: Procedure(s) (LRB): LAPAROSCOPIC PARAESOPHAGEAL HERNIA REPAIR (N/A) LAPAROSCOPIC NISSEN FUNDOPLICATION (N/A)  Patient location during evaluation: PACU Anesthesia Type: General Level of consciousness: sedated, patient cooperative and oriented Pain management: pain level controlled Vital Signs Assessment: post-procedure vital signs reviewed and stable Respiratory status: spontaneous breathing, nonlabored ventilation and respiratory function stable Cardiovascular status: blood pressure returned to baseline and stable Postop Assessment: no signs of nausea or vomiting Anesthetic complications: no       Last Vitals:  Vitals:   01/19/17 1733 01/19/17 1734  BP: (!) 166/100 (!) 160/95  Pulse: 92   Resp: 16   Temp: 37.1 C     Last Pain:  Vitals:   01/19/17 1943  TempSrc:   PainSc: 2                  Damali Broadfoot,E. Lekeshia Kram

## 2017-01-19 NOTE — H&P (Signed)
Bobby Conrad is an 66 y.o. male.   Chief Complaint: here for surgery HPI:  He comes in today for surgery. He reports more frequent sensations of sternal discomfort after eating. He reports it more as a pressure sensation after eating. He states that it has limited the amount that he has been able to eat.  The patient is a 66 year old male who presents with a hiatal hernia. He comes in today for a preoperative visit. I initially met him back in September for evaluation for a large paraesophageal hiatal hernia. He denies any changes since he was last seen. He wanted to delay surgery until the beginning of 2018. He states that he may be more aware of sternal discomfort if he eats too much. He has an occasional sensation that it gets stuck there but denies reflux. His doctor started him on a diuretic otherwise he denies any chest pain, chest pressure left jaw pain and left upper extremity, melena, hematochezia, orthopnea, paroxysmal nocturnal dyspnea or dyspnea on exertion. He underwent a CT scan of his abdomen and pelvis which identified a large paraesophageal hernia with most of the stomach within the chest cavity. 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 The distal aspect of the antrum extends below the diaphragm. There is no obstructive changes seen   08/2016 He is referred by Dr Penelope Coop for evaluation of a paraesophageal hiatal hernia. He states that he was with his wife when she was recovering from her knee surgery and he developed a craving for ice and just started crunching on ice quite frequently. He then had several dark bowel movements. He discussed it with his primary care physician he was concerned about possible anemia. He was confirmed to have a hemoglobin of around 7. He was started on iron replacement. His Hemoccult was reportedly negative. This prompted a referral to [facility]. Upper endoscopy was attempted. There was normal esophagus however there was a very large hiatal  hernia and the endoscope could not be passed the stomach. An upper GI was then performed which showed normal primary esophageal peristalsis. A barium tablet passed through the esophagus into the stomach without esophageal obstruction. A large paraesophageal hiatal hernia was present. 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 The body of the stomach is in the left upper quadrant below the diaphragm and the distal gastric body and antrum are in the right upper quadrant and partially above the diaphragm. There was a question of extrinsic compression of the distal body and antrum. Gastroesophageal reflux was not elicited. The stomach emptied only with the patient the upright position. The proximal small bowel was unremarkable.  He does sleep elevated chronically for years. However he denies any indigestion. He reports a rare reflux. He states that if he eats real rapidly he'll feel like he is choking a little bit and he'll stop and belch and then he'll slightly down how rapidly he is eating. He denies any regurgitation or vomiting. He denies any upper abdominal pain or discomfort.  He denies any chest pain, chest pressure, shows breath, worsening, distal insertion, TIAs or amaurosis fugax.  Past Medical History:  Diagnosis Date  . Anemia   . Torn meniscus     Past Surgical History:  Procedure Laterality Date  . APPENDECTOMY     1960s  . CARPAL TUNNEL RELEASE    . HERNIA REPAIR     1950's  . KNEE ARTHROSCOPY Left 1990s    History reviewed. No pertinent family history. Social History:  reports that  he has never smoked. He has never used smokeless tobacco. He reports that he drinks alcohol. He reports that he does not use drugs.  Allergies: No Known Allergies  Medications Prior to Admission  Medication Sig Dispense Refill  . ferrous sulfate 325 (65 FE) MG tablet Take 325 mg by mouth daily.  5  . hydrochlorothiazide (HYDRODIURIL) 12.5 MG tablet Take 12.5 mg by mouth daily.  5     No results found for this or any previous visit (from the past 48 hour(s)). No results found.  Review of Systems  Constitutional: Negative for weight loss.  HENT: Negative for nosebleeds.   Eyes: Negative for blurred vision.  Respiratory: Negative for shortness of breath.   Cardiovascular: Negative for chest pain, palpitations, orthopnea and PND.       Denies DOE  Genitourinary: Negative for dysuria and hematuria.  Musculoskeletal: Negative.   Skin: Negative for itching and rash.  Neurological: Negative for dizziness, focal weakness, seizures, loss of consciousness and headaches.       Denies TIAs, amaurosis fugax  Endo/Heme/Allergies: Does not bruise/bleed easily.  Psychiatric/Behavioral: The patient is not nervous/anxious.     Blood pressure (!) 159/109, pulse 62, temperature 97.7 F (36.5 C), temperature source Oral, resp. rate 16, height _0  (1.702 m), weight 96.6 kg (213 lb), SpO2 100 %. Physical Exam  Vitals reviewed. Constitutional: He is oriented to person, place, and time. He appears well-developed and well-nourished. No distress.  HENT:  Head: Normocephalic and atraumatic.  Right Ear: External ear normal.  Left Ear: External ear normal.  Eyes: Conjunctivae are normal. No scleral icterus.  Neck: Normal range of motion. Neck supple. No tracheal deviation present. No thyromegaly present.  Cardiovascular: Normal rate and normal heart sounds.   Respiratory: Effort normal and breath sounds normal. No stridor. No respiratory distress. He has no wheezes.  GI: Soft. He exhibits no distension. There is no tenderness. There is no rebound.  Musculoskeletal: He exhibits no edema or tenderness.  Lymphadenopathy:    He has no cervical adenopathy.  Neurological: He is alert and oriented to person, place, and time. He exhibits normal muscle tone.  Skin: Skin is warm and dry. No rash noted. He is not diaphoretic. No erythema. No pallor.  Psychiatric: He has a normal mood and  affect. His behavior is normal. Judgment and thought content normal.     Assessment/Plan Large paraesophageal hernia Hypertension History of anemia  2 operating room for laparoscopic repair of paraesophageal hernia with Nissen fundoplication possible G-tube placement All questions asked and answered. We reviewed the surgical steps. IV antibiotics on call Subcutaneous heparin already given  Leighton Ruff. Redmond Pulling, MD, [place], Bariatric, & Minimally Invasive Surgery [facility], [place]   Gayland Curry, MD 01/19/2017, 7:28 AM

## 2017-01-20 ENCOUNTER — Observation Stay (HOSPITAL_COMMUNITY): Payer: PPO

## 2017-01-20 DIAGNOSIS — K440 Diaphragmatic hernia with obstruction, without gangrene: Secondary | ICD-10-CM | POA: Diagnosis not present

## 2017-01-20 DIAGNOSIS — K224 Dyskinesia of esophagus: Secondary | ICD-10-CM | POA: Diagnosis not present

## 2017-01-20 LAB — BASIC METABOLIC PANEL
ANION GAP: 9 (ref 5–15)
BUN: 12 mg/dL (ref 6–20)
CALCIUM: 8.8 mg/dL — AB (ref 8.9–10.3)
CO2: 29 mmol/L (ref 22–32)
Chloride: 102 mmol/L (ref 101–111)
Creatinine, Ser: 0.88 mg/dL (ref 0.61–1.24)
Glucose, Bld: 116 mg/dL — ABNORMAL HIGH (ref 65–99)
Potassium: 4 mmol/L (ref 3.5–5.1)
Sodium: 140 mmol/L (ref 135–145)

## 2017-01-20 LAB — MAGNESIUM: MAGNESIUM: 1.9 mg/dL (ref 1.7–2.4)

## 2017-01-20 LAB — CBC
HCT: 40.6 % (ref 39.0–52.0)
HEMOGLOBIN: 13.7 g/dL (ref 13.0–17.0)
MCH: 30.5 pg (ref 26.0–34.0)
MCHC: 33.7 g/dL (ref 30.0–36.0)
MCV: 90.4 fL (ref 78.0–100.0)
Platelets: 236 10*3/uL (ref 150–400)
RBC: 4.49 MIL/uL (ref 4.22–5.81)
RDW: 12.9 % (ref 11.5–15.5)
WBC: 13.6 10*3/uL — ABNORMAL HIGH (ref 4.0–10.5)

## 2017-01-20 MED ORDER — PHENYLEPHRINE HCL 0.5 % NA SOLN
1.0000 [drp] | Freq: Four times a day (QID) | NASAL | Status: DC | PRN
  Filled 2017-01-20: qty 15

## 2017-01-20 MED ORDER — IOPAMIDOL (ISOVUE-300) INJECTION 61%
150.0000 mL | Freq: Once | INTRAVENOUS | Status: AC | PRN
  Administered 2017-01-20: 150 mL via ORAL

## 2017-01-20 MED ORDER — IOPAMIDOL (ISOVUE-300) INJECTION 61%: 150.0000 mL | Freq: Once | INTRAVENOUS | Status: DC | PRN

## 2017-01-20 MED ORDER — IOPAMIDOL (ISOVUE-300) INJECTION 61%
INTRAVENOUS | Status: AC
  Filled 2017-01-20: qty 150

## 2017-01-20 NOTE — Op Note (Signed)
NAMEANICETO, DENNING NO.:  [id number]  MEDICAL RECORD NO.:  [id number]  LOCATION:                                 FACILITY:  PHYSICIAN:  Leighton Ruff. Redmond Pulling, MD, [place] OF BIRTH:  September 18, 1951  DATE OF PROCEDURE:  01/19/2017 DATE OF DISCHARGE:                              OPERATIVE REPORT   PREOPERATIVE DIAGNOSIS:  Large paraesophageal hernia.  POSTOPERATIVE DIAGNOSIS:  Large paraesophageal hernia.  PROCEDURE:  Laparoscopic paraesophageal hernia repair with Nissen fundoplication.  SURGEON:  Leighton Ruff. Redmond Pulling, MD, FACS  ASSISTANT SURGEON:  Odis Hollingshead, M.D. FACS (assistant was required for this case to assist with operative exposure and help confirm anatomy given complexity/size of paraesophageal hernia)  ANESTHESIA:  General.  EBL:  50 mL.  SPECIMENS:  Hernia sac in pieces.  INDICATIONS FOR PROCEDURE:  The patient is a very pleasant 66 year old gentleman who initially presented with anemia and ice cravings, and dark bowel movements.  He was found to have a hemoglobin around 7.  He underwent upper endoscopy, which revealed a large hiatal hernia and the endoscope could not be passed into the stomach.  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  The patient was referred for surgical consultation.  He endorsed some sensation of early satiety as well as postprandial chest discomfort.  CT scan was performed, which demonstrated similar findings to the upper GI, just the stomach was involved in the hernia, no evidence of other abdominal contents.  We had a prolonged conversation on 2 different visits about the nature of surgery as well as repair.  We recommended reduction of the stomach along with reapproximation of the crura and Nissen fundoplication with possible mesh on the crura as well as possible G-tube fixation depending on intraoperative findings.  Please see H and P  regarding that discussion regarding risks and benefits.  DESCRIPTION OF PROCEDURE:  The patient was given 5000 units of heparin subcutaneously for DVT prophylaxis.  He was also given oral Tylenol and Neurontin as well preoperatively.  He was then brought to OR 4 and placed under general endotracheal anesthesia.  Sequential compression devices were placed.  A Foley catheter was placed.  His arms were tucked at the side with the appropriate padding.  His abdomen was prepped and draped in the usual standard surgical fashion with ChloraPrep.  A surgical time-out was performed.  Access to the abdomen was gained in the left upper quadrant using the Optiview technique with a 5-mm 0 degree laparoscope.  It was advanced through all layers of the abdominal wall and abdominal cavity was entered.  Pneumoperitoneum was smoothly established up to a patient pressure of 15 mmHg.  Abdominal cavity was surveilled.  There was no evidence of injury to surrounding structures.  The patient was placed in reverse Trendelenburg and a 5-mm trocar was placed above into the left of the umbilicus, 5 mm in the lateral right upper quadrant, and a 12-mm trocar in the right mid hypochondrium and a 5-mm trocar in the left lateral abdominal wall, all under direct visualization.  Exparel was infiltrated in the bilateral lateral upper abdominal walls as a TAP block.  The patient did experience a brief episode of bradycardia and pneumoperitoneum was released and Anesthesia was able to normalize his heart rate.  Pneumoperitoneum was then re-established with no further episodes of bradycardia.  A 5 mm trocar was placed in the subxiphoid position followed by the [facility] liver retractor to lift up the left lobe of the liver.  This exposed the diaphragm with obvious herniated stomach.  Then using gentle traction, we reduced stomach.  The GE junction was above the diaphragm along with large majority of the stomach.  There was  also omentum up in the defect as well.  Initially, we started our dissection on the left side near the left crus, incising the sac with Harmonic Scalpel.  The sac was quite thickened and appeared chronically indurated.  We were able to identify the left crus muscles and dissect the sac away from the that and continued our dissection more anteriorly.  The sac as stated was real thick, so I decided to approach it from the right crus.  The gastrohepatic ligament was incised with Harmonic Scalpel.  The right crus was identified.  Again, the sac was chronically thickened on the side where the lifted off the right crus and using blunt dissection, dissected down from some of the mediastinal structures.  The esophagus was identified and protected.  We ended up going back and forth between the left and right sides, ended up taking down some short gastrics on the greater curvature of the stomach to help further mobilize and reduce the sac from the left side.  We were able to finally get a retroesophageal window and placed a Penrose drain around The upper stomach to aid in traction to help expose the esophagus and mediastinum.  It took approximately 2 hours to reduce the entire sac in its entirety. Some of the sac was excised in a piecemeal fashion just given how bulky and how extensive the sac was to debulk the area of where we were working.  We were able to identify the junction the left crus and the right crus.  We had confirmed location of the esophagus several times throughout mobilization with passage of a bougie.  The anterior vagus nerve was identified and preserved.  Then, wispy attachments around the esophagus were taken down with a combination of blunt dissection along with Harmonic Scalpel, making sure the hot blade was no where near the esophagus.  At this point, the stomach was completely reduced and we had achieved approximately 2 cm of intraabdominal esophageal length.  Some of the  peritoneum had been violated on the left crus of the diaphragm, but at this point, I felt we had achieved enough mobilization of the esophagus and we were ready to plicate the diaphragm muscle back together.  Using 0-Ethibond with an Endo-Stitch, we placed interrupted pledgeted sutures x4.  Each of the sutures were secured with a titanium tie knot.  At this point, I passed the fundus of the stomach behind the esophagus in order to commence my wrap.  Shoe-shine maneuver was performed and easily done.  At this point, a 56-French lighted bougie was advanced by the attending anesthesiologist into the esophagus and down into the stomach.  I then performed a 3 cm wrap with 3 interrupted 0-Ethibond sutures, secured with a titanium tie knot.  At this point, the bougie was removed.  It was completely intact and nonbloody.  At the completion of the procedure, there was some redundant fundus above the wrap into the patient's right near  the right crus of the diaphragm.  The stomach was inspected.  There was 1 location of a small serosal tear of about 1.5 cm.  It was not full thickness.  This was not an unexpected intraabdominal finding given the nature of surgery.  We inspected the omentum and the spleen, which all appeared intact without any evidence of bleeding.  At this point, an EndoCatch bag was advanced into the abdominal cavity and the hernia sac that had been debulked in pieces was placed in the bag and extracted.  We had upsized the laparoscope trocar to a 10 mm trocar to switch to a 10 mm camper to help with better visualization halfway through the procedure.  The 12 mm trocar and the 10 mm trocar site were closed with a single interrupted 0-Vicryl suture using the PMI suture passer with laparoscopic guidance.  The remaining Exparel was infiltrated in a regional fashion.  The liver retractor was removed.  There was no evidence of liver trauma.  Pneumoperitoneum was released and trocars were  removed.  Skin incisions were closed with a 4-0 Monocryl in a subcuticular fashion followed by application of Dermabond.  All needle, instrument, sponge counts were correct x2.  There were no immediate complications.  The patient tolerated procedure well.  He was transported to the recovery room in a stable condition.     Leighton Ruff. Redmond Pulling, MD, FACS     EMW/MEDQ  D:  01/19/2017  T:  01/20/2017  Job:  [id number]  cc:   Wonda Horner, M.D. Fax: [phone]

## 2017-01-20 NOTE — Progress Notes (Signed)
Paged MD and received verbal order for nasal mist at patient request.

## 2017-01-20 NOTE — Progress Notes (Signed)
1 Day Post-Op  Subjective: Had some right hand lateral 3 finger numbness and swelling last night but numbness and swelling significantly improved. Ambulated. Doing IS - upto 1250. Has some central discomfort with deep inspiration. No retching. No nausea.  Objective: Vital signs in last 24 hours: Temp:  [98.4 F (36.9 C)-99.7 F (37.6 C)] 98.7 F (37.1 C) (01/24 0418) Pulse Rate:  [81-102] 81 (01/24 0418) Resp:  [11-17] 16 (01/24 0418) BP: (131-187)/(82-159) 136/82 (01/24 0418) SpO2:  [93 %-100 %] 95 % (01/24 0418) Last BM Date: 01/18/17  Intake/Output from previous day: 01/23 0701 - 01/24 0700 In: 4350 [I.V.:4350] Out: 2600 [Urine:2550; Blood:50] Intake/Output this shift: No intake/output data recorded.  Alert, nad, nontoxic cta b/l Reg Soft, min TTP, incisions c/d/i No edema UE, good pulse  Lab Results:   Recent Labs  01/20/17 0448  WBC 13.6*  HGB 13.7  HCT 40.6  PLT 236   BMET  Recent Labs  01/20/17 0448  NA 140  K 4.0  CL 102  CO2 29  GLUCOSE 116*  BUN 12  CREATININE 0.88  CALCIUM 8.8*   PT/INR No results for input(s): LABPROT, INR in the last 72 hours. ABG No results for input(s): PHART, HCO3 in the last 72 hours.  Invalid input(s): PCO2, PO2  Studies/Results: No results found.  Anti-infectives: Anti-infectives    Start     Dose/Rate Route Frequency Ordered Stop   01/19/17 0517  cefoTEtan in Dextrose 5% (CEFOTAN) IVPB 2 g     2 g Intravenous On call to O.R. 01/19/17 0517 01/19/17 0745      Assessment/Plan: s/p Procedure(s): LAPAROSCOPIC PARAESOPHAGEAL HERNIA REPAIR (N/A) LAPAROSCOPIC NISSEN FUNDOPLICATION (N/A)  Doing well. No fever. No tachycardia.  For UGI this am, if ok will start clears Cont chemical vte prophylaxis pulm toilet Expect numbness was positioning on OR table with hands tucked  Leighton Ruff. Redmond Pulling, MD, FACS General, Bariatric, & Minimally Invasive Surgery [facility] Surgery, [place]   LOS: 0 days    Gayland Curry 01/20/2017

## 2017-01-20 NOTE — Progress Notes (Signed)
Dr Redmond Pulling paged with UGI results.  Orders placed by Dr Redmond Pulling for small sips with clear liquid tray

## 2017-01-20 NOTE — Progress Notes (Signed)
Patient educated on sipping small amounts of clear liquids with instruction for no carbonation or straws.  Patient states understanding.

## 2017-01-21 DIAGNOSIS — K440 Diaphragmatic hernia with obstruction, without gangrene: Secondary | ICD-10-CM | POA: Diagnosis not present

## 2017-01-21 MED ORDER — TRAMADOL HCL 50 MG PO TABS: 50.0000 mg | ORAL_TABLET | Freq: Four times a day (QID) | ORAL | 0 refills | Status: DC | PRN

## 2017-01-21 MED ORDER — TRAMADOL HCL 50 MG PO TABS
50.0000 mg | ORAL_TABLET | Freq: Four times a day (QID) | ORAL | Status: DC | PRN
  Administered 2017-01-21: 50 mg via ORAL
  Filled 2017-01-21: qty 1

## 2017-01-21 MED ORDER — ONDANSETRON HCL 4 MG PO TABS: 4.0000 mg | ORAL_TABLET | Freq: Three times a day (TID) | ORAL | 0 refills | Status: DC | PRN

## 2017-01-21 MED ORDER — PANTOPRAZOLE SODIUM 40 MG PO TBEC: 40.0000 mg | DELAYED_RELEASE_TABLET | Freq: Every day | ORAL | 0 refills | Status: DC

## 2017-01-21 MED ORDER — HYDROCHLOROTHIAZIDE 12.5 MG PO CAPS
12.5000 mg | ORAL_CAPSULE | Freq: Every day | ORAL | Status: DC
Start: 2017-01-21 — End: 2017-01-21
  Administered 2017-01-21: 12.5 mg via ORAL
  Filled 2017-01-21: qty 1

## 2017-01-21 NOTE — Progress Notes (Signed)
Discharge instructions given to patient patient able to explain diet and precautions after surgery through teach back.  Questions answered prescription given to patient.  Discharge home with wife

## 2017-01-21 NOTE — Discharge Instructions (Signed)
EATING AFTER YOUR ESOPHAGEAL SURGERY (Stomach Fundoplication, Hiatal Hernia repair, Achalasia surgery, etc)  ######################################################################  EAT Start with a pureed / full liquid diet (see below) Gradually transition to a high fiber diet with a fiber supplement over the next month after discharge.    WALK Walk an hour a day.  Control your pain to do that.    CONTROL PAIN Control pain so that you can walk, sleep, tolerate sneezing/coughing, go up/down stairs.  HAVE A BOWEL MOVEMENT DAILY Keep your bowels regular to avoid problems.  OK to try a laxative to override constipation.  OK to use an antidairrheal to slow down diarrhea.  Call if not better after 2 tries  CALL IF YOU HAVE PROBLEMS/CONCERNS Call if you are still struggling despite following these instructions. Call if you have concerns not answered by these instructions  ######################################################################   After your esophageal surgery, expect some sticking with swallowing over the next 1-2 months.    If food sticks when you eat, it is called "dysphagia".  This is due to swelling around your esophagus at the wrap & hiatal diaphragm repair.  It will gradually ease off over the next few months.  To help you through this temporary phase, we start you out on a pureed (blenderized) diet.  Your first meal in the hospital was thin liquids.  You should have been given a pureed diet by the time you left the hospital.  We ask patients to stay on a pureed diet for the first 2-3 weeks to avoid anything getting "stuck" near your recent surgery.  Don't be alarmed if your ability to swallow doesn't progress according to this plan.  Everyone is different and some diets can advance more or less quickly.     Some BASIC RULES to follow are:  Maintain an upright position whenever eating or drinking.  Take small bites - just a teaspoon size bite at a time.  Eat slowly.   It may also help to eat only one food at a time.  Consider nibbling through smaller, more frequent meals & avoid the urge to eat BIG meals  Do not push through feelings of fullness, nausea, or bloatedness  Do not mix solid foods and liquids in the same mouthful  Try not to "wash foods down" with large gulps of liquids.  Avoid carbonated (bubbly/fizzy) drinks.    Avoid foods that make you feel gassy or bloated.  Start with bland foods first.  Wait on trying greasy, fried, or spicy meals until you are tolerating more bland solids well.  Understand that it will be hard to burp and belch at first.  This gradually improves with time.  Expect to be more gassy/flatulent/bloated initially.  Walking will help your body manage it better.  Consider using medications for bloating that contain simethicone such as  Maalox or Gas-X   Eat in a relaxed atmosphere & minimize distractions.  Avoid talking while eating.    Do not use straws.  Following each meal, sit in an upright position (90 degree angle) for 60 to 90 minutes.  Going for a short walk can help as well  If food does stick, don't panic.  Try to relax and let the food pass on its own.  Sipping WARM LIQUID such as strong hot black tea can also help slide it down.   Be gradual in changes & use common sense:  -If you easily tolerating a certain "level" of foods, advance to the next level gradually -If you are  having trouble swallowing a particular food, then avoid it.   -If food is sticking when you advance your diet, go back to thinner previous diet (the lower LEVEL) for 1-2 days.  LEVEL 1 = PUREED DIET  Do for the first 2 WEEKS AFTER SURGERY  -Foods in this group are pureed or blenderized to a smooth, mashed potato-like consistency.  -If necessary, the pureed foods can keep their shape with the addition of a thickening agent.   -Meat should be pureed to a smooth, pasty consistency.  Hot broth or gravy may be added to the pureed  meat, approximately 1 oz. of liquid per 3 oz. serving of meat. -CAUTION:  If any foods do not puree into a smooth consistency, swallowing will be more difficult.  (For example, nuts or seeds sometimes do not blend well.)  Hot Foods Cold Foods  Pureed scrambled eggs and cheese Pureed cottage cheese  Baby cereals Thickened juices and nectars  Thinned cooked cereals (no lumps) Thickened milk or eggnog  Pureed [place] toast or pancakes Ensure  Mashed potatoes Ice cream  Pureed parsley, au gratin, scalloped potatoes, candied sweet potatoes Fruit or [place] ice, sherbet  Pureed buttered or alfredo noodles Plain yogurt  Pureed vegetables (no corn or peas) Instant breakfast  Pureed soups and creamed soups Smooth pudding, mousse, custard  Pureed scalloped apples Whipped gelatin  Gravies Sugar, syrup, honey, jelly  Sauces, cheese, tomato, barbecue, white, creamed Cream  Any baby food Creamer  Alcohol in moderation (not beer or champagne) Margarine  Coffee or tea Mayonnaise   Ketchup, mustard   Apple sauce   SAMPLE MENU:  PUREED DIET Breakfast Lunch Dinner   Orange juice, 1/2 cup  Cream of wheat, 1/2 cup  Pineapple juice, 1/2 cup  Pureed [place], barley soup, 3/4 cup  Pureed Hawaiian chicken, 3 oz   Scrambled eggs, mashed or blended with cheese, 1/2 cup  Tea or coffee, 1 cup   Whole milk, 1 cup   Non-dairy creamer, 2 Tbsp.  Mashed potatoes, 1/2 cup  Pureed cooled broccoli, 1/2 cup  Apple sauce, 1/2 cup  Coffee or tea  Mashed potatoes, 1/2 cup  Pureed spinach, 1/2 cup  Frozen yogurt, 1/2 cup  Tea or coffee      LEVEL 2 = SOFT DIET  After your first 2 weeks, you can advance to a soft diet.   Keep on this diet until everything goes down easily.  Hot Foods Cold Foods  White fish Cottage cheese  Stuffed fish Junior baby fruit  Baby food meals Semi thickened juices  Minced soft cooked, scrambled, poached eggs nectars  Souffle & omelets Ripe mashed bananas  Cooked  cereals Canned fruit, pineapple sauce, milk  potatoes Milkshake  Buttered or Alfredo noodles Custard  Cooked cooled vegetable Puddings, including tapioca  Sherbet Yogurt  Vegetable soup or alphabet soup Fruit ice, [place] ice  Gravies Whipped gelatin  Sugar, syrup, honey, jelly Junior baby desserts  Sauces:  Cheese, creamed, barbecue, tomato, white Cream  Coffee or tea Margarine   SAMPLE MENU:  LEVEL 2 Breakfast Lunch Dinner   Orange juice, 1/2 cup  Oatmeal, 1/2 cup  Scrambled eggs with cheese, 1/2 cup  Decaffeinated tea, 1 cup  Whole milk, 1 cup  Non-dairy creamer, 2 Tbsp  Pineapple juice, 1/2 cup  Minced beef, 3 oz  Gravy, 2 Tbsp  Mashed potatoes, 1/2 cup  Minced fresh broccoli, 1/2 cup  Applesauce, 1/2 cup  Coffee, 1 cup  [place], barley soup, 3/4 cup  Minced Hawaiian chicken, 3 oz  Mashed potatoes, 1/2 cup  Cooked spinach, 1/2 cup  Frozen yogurt, 1/2 cup  Non-dairy creamer, 2 Tbsp      LEVEL 3 = CHOPPED DIET  -After all the foods in level 2 (soft diet) are passing through well you should advance up to more chopped foods.  -It is still important to cut these foods into small pieces and eat slowly.  Hot Foods Cold Foods  Poultry Cottage cheese  Chopped Swedish meatballs Yogurt  Meat salads (ground or flaked meat) Milk  Flaked fish (tuna) Milkshakes  Poached or scrambled eggs Soft, cold, dry cereal  Souffles and omelets Fruit juices or nectars  Cooked cereals Chopped canned fruit  Chopped [place] toast or pancakes Canned fruit cocktail  Noodles or pasta (no rice) Pudding, mousse, custard  Cooked vegetables (no frozen peas, corn, or mixed vegetables) Green salad  Canned small sweet peas Ice cream  Creamed soup or vegetable soup Fruit ice, [place] ice  Pureed vegetable soup or alphabet soup Non-dairy creamer  Ground scalloped apples Margarine  Gravies Mayonnaise  Sauces:  Cheese, creamed, barbecue, tomato, white Ketchup  Coffee or tea Mustard    SAMPLE MENU:  LEVEL 3 Breakfast Lunch Dinner   Orange juice, 1/2 cup  Oatmeal, 1/2 cup  Scrambled eggs with cheese, 1/2 cup  Decaffeinated tea, 1 cup  Whole milk, 1 cup  Non-dairy creamer, 2 Tbsp  Ketchup, 1 Tbsp  Margarine, 1 tsp  Salt, 1/4 tsp  Sugar, 2 tsp  Pineapple juice, 1/2 cup  Ground beef, 3 oz  Gravy, 2 Tbsp  Mashed potatoes, 1/2 cup  Cooked spinach, 1/2 cup  Applesauce, 1/2 cup  Decaffeinated coffee  Whole milk  Non-dairy creamer, 2 Tbsp  Margarine, 1 tsp  Salt, 1/4 tsp  Pureed [place], barley soup, 3/4 cup  Barbecue chicken, 3 oz  Mashed potatoes, 1/2 cup  Ground fresh broccoli, 1/2 cup  Frozen yogurt, 1/2 cup  Decaffeinated tea, 1 cup  Non-dairy creamer, 2 Tbsp  Margarine, 1 tsp  Salt, 1/4 tsp  Sugar, 1 tsp    LEVEL 4:  REGULAR FOODS  -Foods in this group are soft, moist, regularly textured foods.   -This level includes meat and breads, which tend to be the hardest things to swallow.   -Eat very slowly, chew well and continue to avoid carbonated drinks. -most people are at this level in 4-6 weeks  Hot Foods Cold Foods  Baked fish or skinned Soft cheeses - cottage cheese  Souffles and omelets Cream cheese  Eggs Yogurt  Stuffed shells Milk  Spaghetti with meat sauce Milkshakes  Cooked cereal Cold dry cereals (no nuts, dried fruit, coconut)  [place] toast or pancakes Crackers  Buttered toast Fruit juices or nectars  Noodles or pasta (no rice) Canned fruit  Potatoes (all types) Ripe bananas  Soft, cooked vegetables (no corn, lima, or baked beans) Peeled, ripe, fresh fruit  Creamed soups or vegetable soup Cakes (no nuts, dried fruit, coconut)  Canned chicken noodle soup Plain doughnuts  Gravies Ice cream  Bacon dressing Pudding, mousse, custard  Sauces:  Cheese, creamed, barbecue, tomato, white Fruit ice, [place] ice, sherbet  Decaffeinated tea or coffee Whipped gelatin  Pork chops Regular gelatin   Canned fruited  gelatin molds   Sugar, syrup, honey, jam, jelly   Cream   Non-dairy   Margarine   Oil   Mayonnaise   Ketchup   Mustard   TROUBLESHOOTING IRREGULAR BOWELS  1) Avoid extremes of bowel  movements (no bad constipation/diarrhea)  °2) Miralax 17gm mixed in 8oz. water or juice-daily. May use BID as needed.  °3) Gas-x,Phazyme, etc. as needed for gas & bloating.  °4) Soft,bland diet. No spicy,greasy,fried foods.  °5) Prilosec over-the-counter as needed  °6) May hold gluten/wheat products from diet to see if symptoms improve.  °7) May try probiotics (Align, Activa, etc) to help calm the bowels down  °7) If symptoms become worse call back immediately. ° ° ° °If you have any questions please call our office at [facility]: [phone]. ° °

## 2017-01-22 NOTE — Discharge Summary (Signed)
Physician Discharge Summary  Bobby Conrad FKC:[id number] DOB: [birth year] DOA: 01/19/2017  PCP: Lujean Amel, MD  Admit date: 01/19/2017 Discharge date: 01/21/2017  Recommendations for Outpatient Follow-up:  1.   Follow-up Information    Gayland Curry, MD Follow up on 02/18/2017.   Specialty:  General Surgery Why:  3:30 PM (pls arrive at 3:15 pm) Contact information: [address] [place] [address] [phone]          Discharge Diagnoses:  Active Problems:   Incarcerated paraesophageal hernia h/o anemia HTN   Surgical Procedure: laparoscopic repair of incarcerated paraesophageal hiatal hernia with nissen fundoplication  Discharge Condition: good Disposition: home   Diet recommendation: full liquids with gradual transition to pureed   Filed Weights   01/19/17 0539  Weight: 96.6 kg (213 lb)    History of present illness:  He comes in today for surgery. He reports more frequent sensations of sternal discomfort after eating. He reports it more as a pressure sensation after eating. He states that it has limited the amount that he has been able to eat.  The patient is a 66 year old male who presents with a hiatal hernia. He comes in today for a preoperative visit. I initially met him back in September for evaluation for a large paraesophageal hiatal hernia. He denies any changes since he was last seen. He wanted to delay surgery until the beginning of 2018. He states that he may be more aware of sternal discomfort if he eats too much. He has an occasional sensation that it gets stuck there but denies reflux. His doctor started him on a diuretic otherwise he denies any chest pain, chest pressure left jaw pain and left upper extremity, melena, hematochezia, orthopnea, paroxysmal nocturnal dyspnea or dyspnea on exertion. He underwent a CT scan of his abdomen and pelvis which identified a large paraesophageal hernia with most of the stomach within the chest  cavity. 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 The distal aspect of the antrum extends below the diaphragm. There is no obstructive changes seen  Hospital Course:  He underwent planned procedure. His hernia sac was rather stuck. On pod 1 he underwent UGI which showed no leak but slowed passage of contrast thru wrap. He was started on clears. He had minimal nausea just mainly soreness. He was maintained on perioperative chemical vte prophylaxis. On POD 2 his diet was advanced to full liquids which he also tolerated. He was ambulating well, minimal pain, afebrile, without tachycardia. He was deemed stable for discharge and we discussed dc instructions. He was given extensive written dietary instructions.   BP (!) 138/91 (BP Location: Right Arm)   Pulse 83   Temp 99.4 F (37.4 C) (Oral)   Resp 16   Ht 5' 7"  (1.702 m)   Wt 96.6 kg (213 lb)   SpO2 97%   BMI 33.36 kg/m   Gen: alert, NAD, non-toxic appearing Pupils: equal, no scleral icterus Pulm: Lungs clear to auscultation, symmetric chest rise CV: regular rate and rhythm Abd: soft,  Min tender, nondistended. Well-healed trocar sites. No cellulitis. No incisional hernia Ext: no edema, no calf tenderness Skin: no rash, no jaundice    Discharge Instructions  Discharge Instructions    Call MD for:    Complete by:  As directed    Temperature >101   Call MD for:  hives    Complete by:  As directed    Call MD for:  persistant dizziness or light-headedness    Complete  by:  As directed    Call MD for:  persistant nausea and vomiting    Complete by:  As directed    Call MD for:  redness, tenderness, or signs of infection (pain, swelling, redness, odor or green/yellow discharge around incision site)    Complete by:  As directed    Call MD for:  severe uncontrolled pain    Complete by:  As directed    Diet - low sodium heart healthy    Complete by:  As directed    Discharge instructions    Complete by:  As directed    See [facility]  discharge instructions   Increase activity slowly    Complete by:  As directed      Allergies as of 01/21/2017   No Known Allergies     Medication List    TAKE these medications   ferrous sulfate 325 (65 FE) MG tablet Take 325 mg by mouth daily.   hydrochlorothiazide 12.5 MG tablet Commonly known as:  HYDRODIURIL Take 12.5 mg by mouth daily.   ondansetron 4 MG tablet Commonly known as:  ZOFRAN Take 1 tablet (4 mg total) by mouth every 8 (eight) hours as needed for nausea or vomiting.   pantoprazole 40 MG tablet Commonly known as:  PROTONIX Take 1 tablet (40 mg total) by mouth daily.   traMADol 50 MG tablet Commonly known as:  ULTRAM Take 1 tablet (50 mg total) by mouth every 6 (six) hours as needed for moderate pain.      Follow-up Information    Gayland Curry, MD Follow up on 02/18/2017.   Specialty:  General Surgery Why:  3:30 PM (pls arrive at 3:15 pm) Contact information: [place]  [place] [address] [phone]            The results of significant diagnostics from this hospitalization (including imaging, microbiology, ancillary and laboratory) are listed below for reference.    Significant Diagnostic Studies: Dg Ugi W/water Sol Cm  Result Date: 01/20/2017 CLINICAL DATA:  Post Nissen fundoplication EXAM: WATER SOLUBLE UPPER GI SERIES TECHNIQUE: Single-column upper GI series was performed using water soluble contrast. CONTRAST:  112m ISOVUE-300 IOPAMIDOL (ISOVUE-300) INJECTION 61% COMPARISON:  CT 11/27/2016 FLUOROSCOPY TIME:  Fluoroscopy Time:  2 minutes Radiation Exposure Index (if provided by the fluoroscopic device): 85.6 mGy Number of Acquired Spot Images: 0 FINDINGS: Fluoroscopic evaluation of swallowing demonstrates stasis of contrast within the esophagus with esophageal spasm and disruption of primary esophageal waves. There is slow emptying of the contrast through the fundoplication area. No evidence of leak. The stomach empties  promptly into the small bowel. IMPRESSION: Mild delay of passage of contrast through the fundoplication area with stasis in the esophagus and tertiary contractions. No evidence of contrast leak. Electronically Signed   By: KRolm BaptiseM.D.   On: 01/20/2017 09:25    Microbiology: No results found for this or any previous visit (from the past 240 hour(s)).   Labs: Basic Metabolic Panel:  Recent Labs Lab 01/20/17 0448  NA 140  K 4.0  CL 102  CO2 29  GLUCOSE 116*  BUN 12  CREATININE 0.88  CALCIUM 8.8*  MG 1.9   Liver Function Tests: No results for input(s): AST, ALT, ALKPHOS, BILITOT, PROT, ALBUMIN in the last 168 hours. No results for input(s): LIPASE, AMYLASE in the last 168 hours. No results for input(s): AMMONIA in the last 168 hours. CBC:  Recent Labs Lab 01/20/17 0448  WBC 13.6*  HGB  13.7  HCT 40.6  MCV 90.4  PLT 236   Cardiac Enzymes: No results for input(s): CKTOTAL, CKMB, CKMBINDEX, TROPONINI in the last 168 hours. BNP: BNP (last 3 results) No results for input(s): BNP in the last 8760 hours.  ProBNP (last 3 results) No results for input(s): PROBNP in the last 8760 hours.  CBG: No results for input(s): GLUCAP in the last 168 hours.  Active Problems:   Incarcerated paraesophageal hernia   Time coordinating discharge: 15 minutes  Signed:  Gayland Curry, MD [facility], [place] [phone] 01/22/2017, 10:53 AM

## 2017-02-05 ENCOUNTER — Other Ambulatory Visit: Payer: Self-pay | Admitting: Family Medicine

## 2017-02-05 ENCOUNTER — Ambulatory Visit
Admission: RE | Admit: 2017-02-05 | Discharge: 2017-02-05 | Disposition: A | Discharge status: Home / Self Care | Payer: PPO | Source: Ambulatory Visit | Attending: Family Medicine | Admitting: Family Medicine

## 2017-02-05 DIAGNOSIS — D509 Iron deficiency anemia, unspecified: Secondary | ICD-10-CM | POA: Diagnosis not present

## 2017-02-05 DIAGNOSIS — Z01818 Encounter for other preprocedural examination: Secondary | ICD-10-CM | POA: Diagnosis not present

## 2017-02-05 DIAGNOSIS — J9811 Atelectasis: Secondary | ICD-10-CM | POA: Diagnosis not present

## 2017-02-05 DIAGNOSIS — M25562 Pain in left knee: Secondary | ICD-10-CM | POA: Diagnosis not present

## 2017-03-09 DIAGNOSIS — M25562 Pain in left knee: Secondary | ICD-10-CM | POA: Diagnosis not present

## 2017-03-09 DIAGNOSIS — M25561 Pain in right knee: Secondary | ICD-10-CM | POA: Diagnosis not present

## 2017-03-16 ENCOUNTER — Ambulatory Visit: Payer: Self-pay | Admitting: Physician Assistant

## 2017-03-16 NOTE — H&P (Signed)
TOTAL KNEE ADMISSION H&P  Patient is being admitted for left total knee arthroplasty.  Subjective:  Chief Complaint:left knee pain.  HPI: Bobby Conrad, 65 y.o. male, has a history of pain and functional disability in the left knee due to arthritis and has failed non-surgical conservative treatments for greater than 12 weeks to includeNSAID's and/or analgesics, corticosteriod injections and activity modification.  Onset of symptoms was gradual, starting 5 years ago with gradually worsening course since that time. The patient noted no past surgery on the left knee(s).  Patient currently rates pain in the left knee(s) at 8 out of 10 with activity. Patient has night pain, worsening of pain with activity and weight bearing, pain that interferes with activities of daily living, pain with passive range of motion, crepitus and joint swelling.  Patient has evidence of periarticular osteophytes and joint space narrowing by imaging studies. There is no active infection.  Patient Active Problem List   Diagnosis Date Noted  . 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 Anemia   . Torn meniscus     Past Surgical History:  Procedure Laterality Date  . APPENDECTOMY     1960s  . CARPAL TUNNEL RELEASE    . HERNIA REPAIR     1950's  . KNEE ARTHROSCOPY Left 1990s  . LAPAROSCOPIC NISSEN FUNDOPLICATION N/A [id number]/61/6073   Procedure: LAPAROSCOPIC NISSEN FUNDOPLICATION;  Surgeon: Greer Pickerel, MD;  Location: [facility];  Service: General;  Laterality: N/A;     (Not in a hospital admission) No Known Allergies  Social History  Substance Use Topics  . Smoking status: Never Smoker  . Smokeless tobacco: Never Used  . Alcohol use Yes     Comment: once or twice a year    No family history on file.   Review of Systems  HENT: Positive for hearing loss.   Genitourinary: Positive for hematuria.  Musculoskeletal: Positive for joint pain.  All other systems reviewed  and are negative.   Objective:  Physical Exam  Constitutional: He is oriented to person, place, and time. He appears well-developed and well-nourished. No distress.  HENT:  Head: Normocephalic and atraumatic.  Nose: Nose normal.  Eyes: Conjunctivae and EOM are normal. Pupils are equal, round, and reactive to light.  Neck: Normal range of motion. Neck supple.  Cardiovascular: Normal rate, regular rhythm, normal heart sounds and intact distal pulses.   Respiratory: Effort normal and breath sounds normal. No respiratory distress. He has no wheezes.  GI: Soft. Bowel sounds are normal. He exhibits no distension. There is no tenderness.  Musculoskeletal:       Left knee: He exhibits swelling and deformity. He exhibits no erythema. Tenderness found.  Lymphadenopathy:    He has no cervical adenopathy.  Neurological: He is alert and oriented to person, place, and time. No cranial nerve deficit.  Skin: Skin is warm and dry. No rash noted. No erythema.  Psychiatric: He has a normal mood and affect. His behavior is normal.    Vital signs in last 24 hours: @VSRANGES @  Labs:   Estimated body mass index is 33.36 kg/m as calculated from the following:   Height as of 01/19/17: 5\' 7"  (1.702 m).   Weight as of 01/19/17: 96.6 kg (213 lb).   Imaging Review Plain radiographs demonstrate moderate degenerative joint disease of the left knee(s). The overall alignment ismild varus. The bone quality appears to be good for age and reported activity level.  Assessment/Plan:  End stage  arthritis, left knee   The patient history, physical examination, clinical judgment of the provider and imaging studies are consistent with end stage degenerative joint disease of the left knee(s) and total knee arthroplasty is deemed medically necessary. The treatment options including medical management, injection therapy arthroscopy and arthroplasty were discussed at length. The risks and benefits of total knee  arthroplasty were presented and reviewed. The risks due to aseptic loosening, infection, stiffness, patella tracking problems, thromboembolic complications and other imponderables were discussed. The patient acknowledged the explanation, agreed to proceed with the plan and consent was signed. Patient is being admitted for inpatient treatment for surgery, pain control, PT, OT, prophylactic antibiotics, VTE prophylaxis, progressive ambulation and ADL's and discharge planning. The patient is planning to be discharged home with home health services

## 2017-03-19 NOTE — Pre-Procedure Instructions (Signed)
    EZIAH NEGRO  03/19/2017      [id number] - [address] [place] [address] Phone: [phone] Fax: [phone]          Your procedure is scheduled on Tues. Apr. 6 at 730 AM        Report to [facility] Admitting at 530 AM  Call this number if you have problems the morning of surgery: [phone]   Remember:  Do not eat food or drink liquids after midnight.  Take these medicines the morning of surgery with A SIP OF WATER ondansetron (zofran), pantoprazole (protonix), tramadol (ultram).   Do not wear jewelry, make-up or nail polish.  Do not wear lotions, powders, or perfumes, or deoderant.  Men may shave face and neck.  Do not bring valuables to the hospital.  [facility] is not responsible for any belongings or valuables.  Contacts, dentures or bridgework may not be worn into surgery.  Leave your suitcase in the car.  After surgery it may be brought to your room.  For patients admitted to the hospital, discharge time will be determined by your treatment team.  Patients discharged the day of surgery will not be allowed to drive home.   Special instructions: see attached  Please read over the following fact sheets that you were given. Pain Booklet, Coughing and Deep Breathing, MRSA Information and Surgical Site Infection Prevention

## 2017-03-22 ENCOUNTER — Encounter (HOSPITAL_COMMUNITY): Payer: Self-pay

## 2017-03-22 ENCOUNTER — Encounter (HOSPITAL_COMMUNITY)
Admission: RE | Admit: 2017-03-22 | Discharge: 2017-03-22 | Disposition: A | Discharge status: Home / Self Care | Payer: PPO | Source: Ambulatory Visit | Attending: Orthopedic Surgery | Admitting: Orthopedic Surgery

## 2017-03-22 DIAGNOSIS — Z01818 Encounter for other preprocedural examination: Secondary | ICD-10-CM | POA: Diagnosis not present

## 2017-03-22 DIAGNOSIS — M1712 Unilateral primary osteoarthritis, left knee: Secondary | ICD-10-CM | POA: Diagnosis not present

## 2017-03-22 HISTORY — DX: Essential (primary) hypertension: I10

## 2017-03-22 HISTORY — DX: Gastro-esophageal reflux disease without esophagitis: K21.9

## 2017-03-22 LAB — URINALYSIS, ROUTINE W REFLEX MICROSCOPIC
BILIRUBIN URINE: NEGATIVE
Glucose, UA: NEGATIVE mg/dL
HGB URINE DIPSTICK: NEGATIVE
Ketones, ur: NEGATIVE mg/dL
Leukocytes, UA: NEGATIVE
Nitrite: NEGATIVE
PH: 6 (ref 5.0–8.0)
Protein, ur: NEGATIVE mg/dL
SPECIFIC GRAVITY, URINE: 1.018 (ref 1.005–1.030)

## 2017-03-22 LAB — COMPREHENSIVE METABOLIC PANEL
ALBUMIN: 4 g/dL (ref 3.5–5.0)
ALT: 16 U/L — ABNORMAL LOW (ref 17–63)
ANION GAP: 8 (ref 5–15)
AST: 18 U/L (ref 15–41)
Alkaline Phosphatase: 78 U/L (ref 38–126)
BILIRUBIN TOTAL: 1 mg/dL (ref 0.3–1.2)
BUN: 10 mg/dL (ref 6–20)
CO2: 33 mmol/L — ABNORMAL HIGH (ref 22–32)
Calcium: 9.5 mg/dL (ref 8.9–10.3)
Chloride: 100 mmol/L — ABNORMAL LOW (ref 101–111)
Creatinine, Ser: 0.83 mg/dL (ref 0.61–1.24)
GLUCOSE: 99 mg/dL (ref 65–99)
POTASSIUM: 3.7 mmol/L (ref 3.5–5.1)
Sodium: 141 mmol/L (ref 135–145)
TOTAL PROTEIN: 6.4 g/dL — AB (ref 6.5–8.1)

## 2017-03-22 LAB — CBC WITH DIFFERENTIAL/PLATELET
BASOS PCT: 1 %
Basophils Absolute: 0.1 10*3/uL (ref 0.0–0.1)
Eosinophils Absolute: 0.1 10*3/uL (ref 0.0–0.7)
Eosinophils Relative: 2 %
HEMATOCRIT: 42.1 % (ref 39.0–52.0)
Hemoglobin: 14.1 g/dL (ref 13.0–17.0)
Lymphocytes Relative: 37 %
Lymphs Abs: 2 10*3/uL (ref 0.7–4.0)
MCH: 29.7 pg (ref 26.0–34.0)
MCHC: 33.5 g/dL (ref 30.0–36.0)
MCV: 88.8 fL (ref 78.0–100.0)
MONO ABS: 0.6 10*3/uL (ref 0.1–1.0)
Monocytes Relative: 10 %
NEUTROS ABS: 2.7 10*3/uL (ref 1.7–7.7)
Neutrophils Relative %: 50 %
Platelets: 224 10*3/uL (ref 150–400)
RBC: 4.74 MIL/uL (ref 4.22–5.81)
RDW: 12.4 % (ref 11.5–15.5)
WBC: 5.4 10*3/uL (ref 4.0–10.5)

## 2017-03-22 LAB — ABO/RH: ABO/RH(D): O POS

## 2017-03-22 LAB — PROTIME-INR
INR: 1
Prothrombin Time: 13.2 seconds (ref 11.4–15.2)

## 2017-03-22 LAB — APTT: aPTT: 33 seconds (ref 24–36)

## 2017-03-22 LAB — SURGICAL PCR SCREEN
MRSA, PCR: NEGATIVE
Staphylococcus aureus: NEGATIVE

## 2017-03-23 LAB — URINE CULTURE: Culture: NO GROWTH

## 2017-03-23 LAB — TYPE AND SCREEN
ABO/RH(D): O POS
ANTIBODY SCREEN: NEGATIVE

## 2017-03-28 DIAGNOSIS — I82409 Acute embolism and thrombosis of unspecified deep veins of unspecified lower extremity: Secondary | ICD-10-CM

## 2017-03-28 HISTORY — PX: JOINT REPLACEMENT: SHX530

## 2017-03-28 HISTORY — DX: Acute embolism and thrombosis of unspecified deep veins of unspecified lower extremity: I82.409

## 2017-04-01 MED ORDER — CEFAZOLIN SODIUM-DEXTROSE 2-4 GM/100ML-% IV SOLN
2.0000 g | INTRAVENOUS | Status: AC
  Administered 2017-04-02: 2 g via INTRAVENOUS
  Filled 2017-04-01: qty 100

## 2017-04-01 MED ORDER — BUPIVACAINE LIPOSOME 1.3 % IJ SUSP
20.0000 mL | INTRAMUSCULAR | Status: AC
  Administered 2017-04-02: 20 mL
  Filled 2017-04-01: qty 20

## 2017-04-01 MED ORDER — SODIUM CHLORIDE 0.9 % IV SOLN
1000.0000 mg | INTRAVENOUS | Status: AC
  Administered 2017-04-02: 1000 mg via INTRAVENOUS
  Filled 2017-04-01: qty 10

## 2017-04-01 MED ORDER — SODIUM CHLORIDE 0.9 % IV SOLN: INTRAVENOUS | Status: DC

## 2017-04-02 ENCOUNTER — Observation Stay (HOSPITAL_COMMUNITY)
Admission: RE | Admit: 2017-04-02 | Discharge: 2017-04-03 | Disposition: A | Discharge status: Home / Self Care | Payer: PPO | Source: Ambulatory Visit | Attending: Orthopedic Surgery | Admitting: Orthopedic Surgery

## 2017-04-02 ENCOUNTER — Encounter (HOSPITAL_COMMUNITY): Payer: Self-pay | Admitting: *Deleted

## 2017-04-02 ENCOUNTER — Encounter (HOSPITAL_COMMUNITY)
Admission: RE | Disposition: A | Discharge status: Home / Self Care | Payer: Self-pay | Source: Ambulatory Visit | Attending: Orthopedic Surgery

## 2017-04-02 ENCOUNTER — Inpatient Hospital Stay (HOSPITAL_COMMUNITY): Payer: PPO | Admitting: Certified Registered Nurse Anesthetist

## 2017-04-02 DIAGNOSIS — I10 Essential (primary) hypertension: Secondary | ICD-10-CM | POA: Diagnosis not present

## 2017-04-02 DIAGNOSIS — M1712 Unilateral primary osteoarthritis, left knee: Principal | ICD-10-CM | POA: Insufficient documentation

## 2017-04-02 DIAGNOSIS — K219 Gastro-esophageal reflux disease without esophagitis: Secondary | ICD-10-CM | POA: Diagnosis not present

## 2017-04-02 DIAGNOSIS — G8918 Other acute postprocedural pain: Secondary | ICD-10-CM | POA: Diagnosis not present

## 2017-04-02 HISTORY — PX: TOTAL KNEE ARTHROPLASTY: SHX125

## 2017-04-02 SURGERY — ARTHROPLASTY, KNEE, TOTAL
Anesthesia: General | Laterality: Left

## 2017-04-02 MED ORDER — TRAMADOL HCL 50 MG PO TABS: 100.0000 mg | ORAL_TABLET | Freq: Four times a day (QID) | ORAL | 0 refills | Status: DC | PRN

## 2017-04-02 MED ORDER — BUPIVACAINE-EPINEPHRINE (PF) 0.25% -1:200000 IJ SOLN
INTRAMUSCULAR | Status: DC | PRN
  Administered 2017-04-02: 50 mL via PERINEURAL

## 2017-04-02 MED ORDER — HYDROMORPHONE HCL 1 MG/ML IJ SOLN: 1.0000 mg | INTRAMUSCULAR | Status: DC | PRN

## 2017-04-02 MED ORDER — MIDAZOLAM HCL 5 MG/5ML IJ SOLN
INTRAMUSCULAR | Status: DC | PRN
  Administered 2017-04-02: 2 mg via INTRAVENOUS

## 2017-04-02 MED ORDER — SODIUM CHLORIDE 0.9 % IR SOLN
Status: DC | PRN
  Administered 2017-04-02: 3000 mL

## 2017-04-02 MED ORDER — FLEET ENEMA 7-19 GM/118ML RE ENEM: 1.0000 | ENEMA | Freq: Once | RECTAL | Status: DC | PRN

## 2017-04-02 MED ORDER — PHENOL 1.4 % MT LIQD: 1.0000 | OROMUCOSAL | Status: DC | PRN

## 2017-04-02 MED ORDER — LIDOCAINE 2% (20 MG/ML) 5 ML SYRINGE
INTRAMUSCULAR | Status: AC
  Filled 2017-04-02: qty 10

## 2017-04-02 MED ORDER — KETOROLAC TROMETHAMINE 30 MG/ML IJ SOLN
INTRAMUSCULAR | Status: AC
  Filled 2017-04-02: qty 1

## 2017-04-02 MED ORDER — MIDAZOLAM HCL 2 MG/2ML IJ SOLN
INTRAMUSCULAR | Status: AC
  Filled 2017-04-02: qty 2

## 2017-04-02 MED ORDER — FENTANYL CITRATE (PF) 100 MCG/2ML IJ SOLN
INTRAMUSCULAR | Status: DC | PRN
  Administered 2017-04-02 (×3): 50 ug via INTRAVENOUS
  Administered 2017-04-02: 100 ug via INTRAVENOUS

## 2017-04-02 MED ORDER — CHLORHEXIDINE GLUCONATE 4 % EX LIQD: 60.0000 mL | Freq: Once | CUTANEOUS | Status: DC

## 2017-04-02 MED ORDER — POLYETHYLENE GLYCOL 3350 17 G PO PACK: 17.0000 g | PACK | Freq: Every day | ORAL | Status: DC | PRN

## 2017-04-02 MED ORDER — TRAMADOL HCL 50 MG PO TABS
100.0000 mg | ORAL_TABLET | Freq: Four times a day (QID) | ORAL | Status: DC | PRN
  Administered 2017-04-02: 100 mg via ORAL
  Filled 2017-04-02: qty 2

## 2017-04-02 MED ORDER — HYDROCODONE-ACETAMINOPHEN 5-325 MG PO TABS: 1.0000 | ORAL_TABLET | ORAL | 0 refills | Status: DC | PRN

## 2017-04-02 MED ORDER — CEFAZOLIN IN D5W 1 GM/50ML IV SOLN
1.0000 g | Freq: Four times a day (QID) | INTRAVENOUS | Status: AC
  Administered 2017-04-02 (×2): 1 g via INTRAVENOUS
  Filled 2017-04-02 (×2): qty 50

## 2017-04-02 MED ORDER — FENTANYL CITRATE (PF) 250 MCG/5ML IJ SOLN
INTRAMUSCULAR | Status: AC
  Filled 2017-04-02: qty 5

## 2017-04-02 MED ORDER — BUPIVACAINE HCL (PF) 0.25 % IJ SOLN
INTRAMUSCULAR | Status: AC
  Filled 2017-04-02: qty 30

## 2017-04-02 MED ORDER — METOCLOPRAMIDE HCL 5 MG PO TABS
5.0000 mg | ORAL_TABLET | Freq: Three times a day (TID) | ORAL | Status: DC | PRN
Start: 2017-04-02 — End: 2017-04-03

## 2017-04-02 MED ORDER — SODIUM CHLORIDE 0.9% FLUSH
INTRAVENOUS | Status: DC | PRN
  Administered 2017-04-02: 50 mL via INTRAVENOUS

## 2017-04-02 MED ORDER — ONDANSETRON HCL 4 MG/2ML IJ SOLN
4.0000 mg | Freq: Four times a day (QID) | INTRAMUSCULAR | Status: DC | PRN
  Administered 2017-04-02: 4 mg via INTRAVENOUS
  Filled 2017-04-02: qty 2

## 2017-04-02 MED ORDER — APIXABAN 2.5 MG PO TABS
2.5000 mg | ORAL_TABLET | Freq: Two times a day (BID) | ORAL | Status: DC
Start: 2017-04-03 — End: 2017-04-03
  Administered 2017-04-03: 2.5 mg via ORAL
  Filled 2017-04-02 (×2): qty 1

## 2017-04-02 MED ORDER — PROPOFOL 10 MG/ML IV BOLUS
INTRAVENOUS | Status: AC
  Filled 2017-04-02: qty 40

## 2017-04-02 MED ORDER — DIPHENHYDRAMINE HCL 12.5 MG/5ML PO ELIX: 12.5000 mg | ORAL_SOLUTION | ORAL | Status: DC | PRN

## 2017-04-02 MED ORDER — DEXTROSE 5 % IV SOLN
INTRAVENOUS | Status: DC | PRN
  Administered 2017-04-02: 25 ug/min via INTRAVENOUS

## 2017-04-02 MED ORDER — SORBITOL 70 % SOLN
30.0000 mL | Freq: Every day | Status: DC | PRN
Start: 2017-04-02 — End: 2017-04-03

## 2017-04-02 MED ORDER — ACETAMINOPHEN 650 MG RE SUPP: 650.0000 mg | Freq: Four times a day (QID) | RECTAL | Status: DC | PRN

## 2017-04-02 MED ORDER — ONDANSETRON HCL 4 MG/2ML IJ SOLN
INTRAMUSCULAR | Status: AC
  Filled 2017-04-02: qty 2

## 2017-04-02 MED ORDER — HYDROCHLOROTHIAZIDE 25 MG PO TABS
12.5000 mg | ORAL_TABLET | Freq: Every day | ORAL | Status: DC
  Administered 2017-04-02 – 2017-04-03 (×2): 12.5 mg via ORAL
  Filled 2017-04-02 (×2): qty 1

## 2017-04-02 MED ORDER — APIXABAN 2.5 MG PO TABS: 2.5000 mg | ORAL_TABLET | Freq: Two times a day (BID) | ORAL | 0 refills | Status: DC

## 2017-04-02 MED ORDER — PHENYLEPHRINE HCL 10 MG/ML IJ SOLN
INTRAMUSCULAR | Status: DC | PRN
  Administered 2017-04-02: 80 ug via INTRAVENOUS
  Administered 2017-04-02: 40 ug via INTRAVENOUS
  Administered 2017-04-02: 80 ug via INTRAVENOUS

## 2017-04-02 MED ORDER — ONDANSETRON HCL 4 MG PO TABS: 4.0000 mg | ORAL_TABLET | Freq: Four times a day (QID) | ORAL | Status: DC | PRN

## 2017-04-02 MED ORDER — DOCUSATE SODIUM 100 MG PO CAPS
100.0000 mg | ORAL_CAPSULE | Freq: Two times a day (BID) | ORAL | Status: DC
Start: 2017-04-02 — End: 2017-04-03
  Administered 2017-04-02 – 2017-04-03 (×3): 100 mg via ORAL
  Filled 2017-04-02 (×4): qty 1

## 2017-04-02 MED ORDER — PROPOFOL 10 MG/ML IV BOLUS
INTRAVENOUS | Status: DC | PRN
  Administered 2017-04-02: 200 mg via INTRAVENOUS

## 2017-04-02 MED ORDER — HYDROMORPHONE HCL 1 MG/ML IJ SOLN: 0.2500 mg | INTRAMUSCULAR | Status: DC | PRN

## 2017-04-02 MED ORDER — TRANEXAMIC ACID 1000 MG/10ML IV SOLN
1000.0000 mg | Freq: Once | INTRAVENOUS | Status: AC
  Administered 2017-04-02: 1000 mg via INTRAVENOUS
  Filled 2017-04-02: qty 10

## 2017-04-02 MED ORDER — BUPIVACAINE-EPINEPHRINE (PF) 0.5% -1:200000 IJ SOLN
INTRAMUSCULAR | Status: DC | PRN
  Administered 2017-04-02: 30 mL via PERINEURAL

## 2017-04-02 MED ORDER — MENTHOL 3 MG MT LOZG: 1.0000 | LOZENGE | OROMUCOSAL | Status: DC | PRN

## 2017-04-02 MED ORDER — SODIUM CHLORIDE 0.9 % IV SOLN
INTRAVENOUS | Status: DC
  Administered 2017-04-02 – 2017-04-03 (×2): via INTRAVENOUS

## 2017-04-02 MED ORDER — TRANEXAMIC ACID 1000 MG/10ML IV SOLN
2000.0000 mg | INTRAVENOUS | Status: AC
  Administered 2017-04-02: 2000 mg via TOPICAL
  Filled 2017-04-02: qty 20

## 2017-04-02 MED ORDER — MEPERIDINE HCL 25 MG/ML IJ SOLN: 6.2500 mg | INTRAMUSCULAR | Status: DC | PRN

## 2017-04-02 MED ORDER — EPHEDRINE 5 MG/ML INJ
INTRAVENOUS | Status: AC
  Filled 2017-04-02: qty 10

## 2017-04-02 MED ORDER — ONDANSETRON HCL 4 MG/2ML IJ SOLN
INTRAMUSCULAR | Status: DC | PRN
  Administered 2017-04-02: 4 mg via INTRAVENOUS

## 2017-04-02 MED ORDER — KETOROLAC TROMETHAMINE 30 MG/ML IJ SOLN
INTRAMUSCULAR | Status: DC | PRN
  Administered 2017-04-02: 30 mg via INTRAVENOUS

## 2017-04-02 MED ORDER — LACTATED RINGERS IV SOLN
INTRAVENOUS | Status: DC | PRN
  Administered 2017-04-02 (×2): via INTRAVENOUS

## 2017-04-02 MED ORDER — ACETAMINOPHEN 325 MG PO TABS: 650.0000 mg | ORAL_TABLET | Freq: Four times a day (QID) | ORAL | Status: DC | PRN

## 2017-04-02 MED ORDER — HYDROCODONE-ACETAMINOPHEN 5-325 MG PO TABS
1.0000 | ORAL_TABLET | ORAL | Status: DC | PRN
  Administered 2017-04-03 (×2): 2 via ORAL
  Filled 2017-04-02 (×3): qty 2

## 2017-04-02 MED ORDER — PHENYLEPHRINE 40 MCG/ML (10ML) SYRINGE FOR IV PUSH (FOR BLOOD PRESSURE SUPPORT)
PREFILLED_SYRINGE | INTRAVENOUS | Status: AC
  Filled 2017-04-02: qty 10

## 2017-04-02 MED ORDER — PROMETHAZINE HCL 25 MG/ML IJ SOLN: 6.2500 mg | INTRAMUSCULAR | Status: DC | PRN

## 2017-04-02 MED ORDER — KETOROLAC TROMETHAMINE 30 MG/ML IJ SOLN: 30.0000 mg | Freq: Once | INTRAMUSCULAR | Status: DC | PRN

## 2017-04-02 MED ORDER — EPHEDRINE SULFATE 50 MG/ML IJ SOLN
INTRAMUSCULAR | Status: DC | PRN
  Administered 2017-04-02 (×2): 10 mg via INTRAVENOUS

## 2017-04-02 MED ORDER — METOCLOPRAMIDE HCL 5 MG/ML IJ SOLN: 5.0000 mg | Freq: Three times a day (TID) | INTRAMUSCULAR | Status: DC | PRN

## 2017-04-02 MED ORDER — LIDOCAINE HCL (CARDIAC) 20 MG/ML IV SOLN
INTRAVENOUS | Status: DC | PRN
  Administered 2017-04-02: 100 mg via INTRAVENOUS

## 2017-04-02 MED ORDER — EPINEPHRINE PF 1 MG/ML IJ SOLN
INTRAMUSCULAR | Status: AC
  Filled 2017-04-02: qty 1

## 2017-04-02 SURGICAL SUPPLY — 69 items
BANDAGE ACE 4X5 VEL STRL LF (GAUZE/BANDAGES/DRESSINGS) ×3 IMPLANT
BANDAGE ACE 6X5 VEL STRL LF (GAUZE/BANDAGES/DRESSINGS) ×3 IMPLANT
BANDAGE ESMARK 6X9 LF (GAUZE/BANDAGES/DRESSINGS) ×1 IMPLANT
BLADE SAGITTAL 25.0X1.19X90 (BLADE) ×2 IMPLANT
BLADE SAGITTAL 25.0X1.19X90MM (BLADE) ×1
BLADE SAW SAG 90X13X1.27 (BLADE) ×3 IMPLANT
BNDG CMPR 9X6 STRL LF SNTH (GAUZE/BANDAGES/DRESSINGS) ×1
BNDG ESMARK 6X9 LF (GAUZE/BANDAGES/DRESSINGS) ×3
BOWL SMART MIX CTS (DISPOSABLE) ×3 IMPLANT
CAP KNEE TOTAL 3 SIGMA ×2 IMPLANT
CEMENT HV SMART SET (Cement) ×4 IMPLANT
COVER SURGICAL LIGHT HANDLE (MISCELLANEOUS) ×3 IMPLANT
CUFF TOURNIQUET SINGLE 34IN LL (TOURNIQUET CUFF) ×3 IMPLANT
DRAPE INCISE IOBAN 66X45 STRL (DRAPES) IMPLANT
DRAPE ORTHO SPLIT 77X108 STRL (DRAPES) ×6
DRAPE SURG ORHT 6 SPLT 77X108 (DRAPES) ×2 IMPLANT
DRAPE U-SHAPE 47X51 STRL (DRAPES) ×5 IMPLANT
DRSG ADAPTIC 3X8 NADH LF (GAUZE/BANDAGES/DRESSINGS) ×3 IMPLANT
DRSG PAD ABDOMINAL 8X10 ST (GAUZE/BANDAGES/DRESSINGS) ×6 IMPLANT
DURAPREP 26ML APPLICATOR (WOUND CARE) ×3 IMPLANT
ELECT REM PT RETURN 9FT ADLT (ELECTROSURGICAL) ×3
ELECTRODE REM PT RTRN 9FT ADLT (ELECTROSURGICAL) ×1 IMPLANT
EVACUATOR 1/8 PVC DRAIN (DRAIN) IMPLANT
FACESHIELD WRAPAROUND (MASK) ×9 IMPLANT
FACESHIELD WRAPAROUND OR TEAM (MASK) ×2 IMPLANT
FLOSEAL 10ML (HEMOSTASIS) IMPLANT
GAUZE SPONGE 4X4 12PLY STRL (GAUZE/BANDAGES/DRESSINGS) ×3 IMPLANT
GLOVE BIO SURGEON STRL SZ 6.5 (GLOVE) ×2 IMPLANT
GLOVE BIO SURGEONS STRL SZ 6.5 (GLOVE) ×2
GLOVE BIOGEL PI IND STRL 6.5 (GLOVE) IMPLANT
GLOVE BIOGEL PI IND STRL 8 (GLOVE) ×4 IMPLANT
GLOVE BIOGEL PI INDICATOR 6.5 (GLOVE) ×8
GLOVE BIOGEL PI INDICATOR 8 (GLOVE) ×8
GLOVE ORTHO TXT STRL SZ7.5 (GLOVE) ×3 IMPLANT
GLOVE SURG ORTHO 8.0 STRL STRW (GLOVE) ×3 IMPLANT
GLOVE SURG SS PI 6.0 STRL IVOR (GLOVE) ×4 IMPLANT
GOWN STRL REUS W/ TWL LRG LVL3 (GOWN DISPOSABLE) ×2 IMPLANT
GOWN STRL REUS W/ TWL XL LVL3 (GOWN DISPOSABLE) ×1 IMPLANT
GOWN STRL REUS W/TWL 2XL LVL3 (GOWN DISPOSABLE) ×3 IMPLANT
GOWN STRL REUS W/TWL LRG LVL3 (GOWN DISPOSABLE) ×9
GOWN STRL REUS W/TWL XL LVL3 (GOWN DISPOSABLE) ×3
HANDPIECE INTERPULSE COAX TIP (DISPOSABLE) ×3
HOOD PEEL AWAY FACE SHEILD DIS (HOOD) ×3 IMPLANT
IMMOBILIZER KNEE 22 UNIV (SOFTGOODS) ×2 IMPLANT
KIT BASIN OR (CUSTOM PROCEDURE TRAY) ×3 IMPLANT
KIT ROOM TURNOVER OR (KITS) ×3 IMPLANT
MANIFOLD NEPTUNE II (INSTRUMENTS) ×3 IMPLANT
NEEDLE 22X1 1/2 (OR ONLY) (NEEDLE) ×6 IMPLANT
NS IRRIG 1000ML POUR BTL (IV SOLUTION) ×3 IMPLANT
PACK TOTAL JOINT (CUSTOM PROCEDURE TRAY) ×3 IMPLANT
PAD ARMBOARD 7.5X6 YLW CONV (MISCELLANEOUS) ×6 IMPLANT
PAD CAST 4YDX4 CTTN HI CHSV (CAST SUPPLIES) ×1 IMPLANT
PADDING CAST COTTON 4X4 STRL (CAST SUPPLIES) ×3
PADDING CAST COTTON 6X4 STRL (CAST SUPPLIES) ×3 IMPLANT
SET HNDPC FAN SPRY TIP SCT (DISPOSABLE) ×1 IMPLANT
STAPLER VISISTAT 35W (STAPLE) ×3 IMPLANT
SUCTION FRAZIER HANDLE 10FR (MISCELLANEOUS) ×2
SUCTION TUBE FRAZIER 10FR DISP (MISCELLANEOUS) ×1 IMPLANT
SUT ETHIBOND NAB CT1 #1 30IN (SUTURE) ×7 IMPLANT
SUT VIC AB 0 CT1 27 (SUTURE) ×3
SUT VIC AB 0 CT1 27XBRD ANBCTR (SUTURE) ×1 IMPLANT
SUT VIC AB 2-0 CT1 27 (SUTURE) ×6
SUT VIC AB 2-0 CT1 TAPERPNT 27 (SUTURE) ×2 IMPLANT
SYR CONTROL 10ML LL (SYRINGE) ×6 IMPLANT
TOWEL OR 17X24 6PK STRL BLUE (TOWEL DISPOSABLE) ×3 IMPLANT
TOWEL OR 17X26 10 PK STRL BLUE (TOWEL DISPOSABLE) ×3 IMPLANT
TRAY CATH 16FR W/PLASTIC CATH (SET/KITS/TRAYS/PACK) IMPLANT
TRAY FOLEY W/METER SILVER 16FR (SET/KITS/TRAYS/PACK) IMPLANT
WATER STERILE IRR 1000ML POUR (IV SOLUTION) ×3 IMPLANT

## 2017-04-02 NOTE — Evaluation (Signed)
Physical Therapy Evaluation Patient Details Name: Bobby Conrad MRN: [id number] DOB: 1951/11/02 Today's Date: 04/02/2017   History of Present Illness  66 y.o. male, s/p left total knee arthroplasty.   Clinical Impression  Pt is s/p Left TKA presenting with the deficits listed below (see PT Problem List). Nerve block still in effect (no pain.) Good motor control of Lt knee. Able to ambulate 90 feet post op day #0 with a rolling walker for support. Educated on knee precautions and safety with mobility. Wife will be at home to assist. Pt will benefit from skilled PT to increase their independence and safety with mobility to allow discharge to the venue listed below.      Follow Up Recommendations Home health PT    Equipment Recommendations  None recommended by PT    Recommendations for Other Services       Precautions / Restrictions Precautions Precautions: Knee Precaution Booklet Issued: Yes (comment) Precaution Comments: reviewed Required Braces or Orthoses: Knee Immobilizer - Left (first 24 hr only) Knee Immobilizer - Left: Other (comment) (24 hour post op) Restrictions Weight Bearing Restrictions: Yes LLE Weight Bearing: Weight bearing as tolerated      Mobility  Bed Mobility Overal bed mobility: Modified Independent             General bed mobility comments: extra time, no assist needed  Transfers Overall transfer level: Needs assistance Equipment used: Rolling walker (2 wheeled) Transfers: Sit to/from Stand Sit to Stand: Supervision         General transfer comment: supervision for safety. VC for hand placement. Good power-up to stand, and good control with descent to chair.  Ambulation/Gait Ambulation/Gait assistance: Min guard Ambulation Distance (Feet): 90 Feet Assistive device: Rolling walker (2 wheeled) Gait Pattern/deviations: Step-to pattern;Decreased step length - right;Decreased stance time - left;Decreased stride length;Decreased weight shift  to left;Antalgic Gait velocity: slow Gait velocity interpretation: Below normal speed for age/gender General Gait Details: Educated on safe DME use with a rolling walker. VC for sequencing. No significant buckling noted while wearing knee immobilizer. Occasional hyperextension noted when taking too large of a step.   Stairs            Wheelchair Mobility    Modified Rankin (Stroke Patients Only)       Balance Overall balance assessment: Needs assistance Sitting-balance support: No upper extremity supported;Feet supported Sitting balance-Leahy Scale: Normal     Standing balance support: No upper extremity supported Standing balance-Leahy Scale: Fair                               Pertinent Vitals/Pain Pain Assessment: No/denies pain    Home Living Family/patient expects to be discharged to:: Private residence Living Arrangements: Spouse/significant other Available Help at Discharge: Family;Available 24 hours/day Type of Home: House Home Access: Stairs to enter Entrance Stairs-Rails: Right Entrance Stairs-Number of Steps: 5 Home Layout: One level Home Equipment: Walker - 2 wheels;Bedside commode      Prior Function Level of Independence: Independent               Hand Dominance        Extremity/Trunk Assessment   Upper Extremity Assessment Upper Extremity Assessment: Defer to OT evaluation    Lower Extremity Assessment Lower Extremity Assessment: LLE deficits/detail LLE Deficits / Details: 10 degree lag with SLR, block still in effect but without cutaneous numbness.    Cervical / Trunk Assessment Cervical / Trunk Assessment:  Normal  Communication   Communication: No difficulties  Cognition Arousal/Alertness: Awake/alert Behavior During Therapy: WFL for tasks assessed/performed Overall Cognitive Status: Within Functional Limits for tasks assessed                                        General Comments General  comments (skin integrity, edema, etc.): Mild nausea after ambulating. RN notified. Improved with seated rest    Exercises Total Joint Exercises Ankle Circles/Pumps: AROM;Both;10 reps;Seated Quad Sets: AROM;Left;10 reps;Seated Straight Leg Raises: AROM;Left;10 reps;Supine Long Arc Quad: AROM;Left;5 reps;Seated Knee Flexion: AROM;Left;5 reps;Seated   Assessment/Plan    PT Assessment Patient needs continued PT services  PT Problem List Decreased strength;Decreased range of motion;Decreased activity tolerance;Decreased balance;Decreased mobility;Decreased knowledge of use of DME;Decreased knowledge of precautions;Pain       PT Treatment Interventions DME instruction;Gait training;Stair training;Functional mobility training;Therapeutic activities;Therapeutic exercise;Neuromuscular re-education;Balance training;Patient/family education    PT Goals (Current goals can be found in the Care Plan section)  Acute Rehab PT Goals Patient Stated Goal: Go home tomorrow PT Goal Formulation: With patient Time For Goal Achievement: 04/09/17 Potential to Achieve Goals: Good    Frequency BID   Barriers to discharge        Co-evaluation               End of Session Equipment Utilized During Treatment: Gait belt;Left knee immobilizer Activity Tolerance: Patient tolerated treatment well Patient left: in chair;with call bell/phone within reach;with family/visitor present;with nursing/sitter in room (zero knee in place) Nurse Communication: Mobility status;Other (comment) (nausea) PT Visit Diagnosis: Difficulty in walking, not elsewhere classified (R26.2);Muscle weakness (generalized) (M62.81)    Time: 5056-9794 PT Time Calculation (min) (ACUTE ONLY): 34 min   Charges:   PT Evaluation $PT Eval Low Complexity: 1 Procedure PT Treatments $Gait Training: 8-22 mins   PT G Codes:   PT G-Codes **NOT FOR INPATIENT CLASS** Functional Assessment Tool Used: AM-PAC 6 Clicks Basic  Mobility;Clinical judgement Functional Limitation: Mobility: Walking and moving around Mobility: Walking and Moving Around Current Status (I0165): At least 20 percent but less than 40 percent impaired, limited or restricted Mobility: Walking and Moving Around Goal Status [id number]): At least 1 percent but less than 20 percent impaired, limited or restricted    [facility], [place] 04/02/2017, 3:07 PM

## 2017-04-02 NOTE — Interval H&P Note (Signed)
History and Physical Interval Note:  04/02/2017 7:25 AM  Bobby Conrad  has presented today for surgery, with the diagnosis of OA LEFT KNEE  The various methods of treatment have been discussed with the patient and family. After consideration of risks, benefits and other options for treatment, the patient has consented to  Procedure(s): TOTAL KNEE ARTHROPLASTY (Left) as a surgical intervention .  The patient's history has been reviewed, patient examined, no change in status, stable for surgery.  I have reviewed the patient's chart and labs.  Questions were answered to the patient's satisfaction.     Makih Stefanko JR,W D

## 2017-04-02 NOTE — Transfer of Care (Signed)
Immediate Anesthesia Transfer of Care Note  Patient: Bobby Conrad  Procedure(s) Performed: Procedure(s): TOTAL KNEE ARTHROPLASTY (Left)  Patient Location: PACU  Anesthesia Type:GA combined with regional for post-op pain  Level of Consciousness: awake and alert   Airway & Oxygen Therapy: Patient Spontanous Breathing and Patient connected to nasal cannula oxygen  Post-op Assessment: Report given to RN and Post -op Vital signs reviewed and stable  Post vital signs: Reviewed and stable  Last Vitals:  Vitals:   04/02/17 0722 04/02/17 0940  BP:    Pulse: 74   Resp:    Temp:  (P) 36.1 C    Last Pain:  Vitals:   04/02/17 0940  TempSrc:   PainSc: (P) Asleep      Patients Stated Pain Goal: 3 (40/97/35 3299)  Complications: No apparent anesthesia complications

## 2017-04-02 NOTE — Anesthesia Procedure Notes (Signed)
Procedure Name: LMA Insertion Date/Time: 04/02/2017 7:39 AM Performed by: Valda Favia Pre-anesthesia Checklist: Patient identified, Emergency Drugs available, Suction available, Patient being monitored and Timeout performed Patient Re-evaluated:Patient Re-evaluated prior to inductionOxygen Delivery Method: Circle system utilized Preoxygenation: Pre-oxygenation with 100% oxygen Intubation Type: IV induction Ventilation: Mask ventilation without difficulty LMA: LMA inserted LMA Size: 5.0 Number of attempts: 1 Placement Confirmation: positive ETCO2 and breath sounds checked- equal and bilateral Tube secured with: Tape Dental Injury: Teeth and Oropharynx as per pre-operative assessment

## 2017-04-02 NOTE — Op Note (Signed)
NAME:  JHACE, FENNELL NO.:  [id number]  MEDICAL RECORD NO.:  [id number]  LOCATION:  [facility]                         FACILITY:  [facility]  PHYSICIAN:  Lockie Pares, M.D.    DATE OF BIRTH:  [birth year]  DATE OF PROCEDURE:  04/02/2017 DATE OF DISCHARGE:                              OPERATIVE REPORT   PREOPERATIVE DIAGNOSIS:  Severe osteoarthritis, left knee with varus deformity.  POSTOPERATIVE DIAGNOSIS:  Severe osteoarthritis, left knee with varus deformity.  OPERATION:  Left total knee replacement (Sigma cemented knee, posterior stabilized with size 3 femur, size 350 mm bearing, size 4 tibia, and 38 mm all poly patella).  SURGEON:  Lockie Pares, M.D.  ASSISTANT:  Chriss Czar, PA-C.  ANESTHESIA:  General with a femoral nerve block with local supplementation.  DESCRIPTION OF PROCEDURE:  Supine position, exsanguination of leg, inflation of tourniquet to 350.  Straight skin incision was made with a medial parapatellar approach to the knee made.  We cut 11 mm 5-degree valgus cut on the femur, followed by cutting about 3-4 mm below the most diseased medial compartment.  Extension gap base eventually measured at 15 mm.  Femoral size to be a size 3, followed by cutting, placing the all in 1 cutting block, appropriate degree of external rotation.  We then placed anterior and posterior chamfer cuts and measured the flexion gap at 15 mm.  Complete release of the PCL was done as well as stripping of the posterior capsule.  Keyhole was cut for the tibial base plate. Trial tibial base plate placed.  The box cut for the femur as well. Trials on the femur were done with the appropriate size with a 15 mm bearing.  A 38 mm all poly patellar trial was placed after cutting about 9 mm off the patella leaving about 16-17 mm of native patella.  Again, the patient had had a moderate flexion contracture preop in its full extension with the trials in, good resolution of the varus  deformity, and good stability mediolaterally.  Final prosthesis was inserted, tibia followed by femur patella.  We allowed the cement to harden with a trial spacer in.  A mixture of Exparel and Marcaine with epinephrine was infiltrated in the capsule and subcutaneous tissue.  Once the cement was hardened, the trial bearing was removed.  No excess cement was noted in posterior aspect of the knee, copious irrigation was carried out.  The tourniquet was released.  No significant bleeding was noted.  Closure was affected after placement of the final bearing with #1 Ethibond, 2-0 Vicryl, and skin clips.  Lightly compressive sterile dressing, knee immobilizer was applied, taken to recovery room in stable condition.     Lockie Pares, M.D.     WDC/MEDQ  D:  04/02/2017  T:  04/02/2017  Job:  [id number]

## 2017-04-02 NOTE — Brief Op Note (Signed)
04/02/2017  9:40 AM  PATIENT:  Marlise Eves  66 y.o. male  PRE-OPERATIVE DIAGNOSIS:  OA LEFT KNEE  POST-OPERATIVE DIAGNOSIS:  OA LEFT KNEE  PROCEDURE:  Procedure(s): TOTAL KNEE ARTHROPLASTY (Left)  SURGEON:  Surgeon(s) and Role:    * Earlie Server, MD - Primary  PHYSICIAN ASSISTANT: Chriss Czar, PA-C  ASSISTANTS: OR staff x1    ANESTHESIA:   local, regional and general  EBL:  Total I/O In: 1000 [I.V.:1000] Out: -   BLOOD ADMINISTERED:none  DRAINS: none   LOCAL MEDICATIONS USED:  MARCAINE     SPECIMEN:  No Specimen  DISPOSITION OF SPECIMEN:  N/A  COUNTS:  YES  TOURNIQUET:   Total Tourniquet Time Documented: Thigh (Left) - 62 minutes Total: Thigh (Left) - 62 minutes   DICTATION: .Other Dictation: Dictation Number unknown  PLAN OF CARE: Admit to inpatient   PATIENT DISPOSITION:  PACU - hemodynamically stable.   Delay start of Pharmacological VTE agent (>24hrs) due to surgical blood loss or risk of bleeding: yes

## 2017-04-02 NOTE — Care Management Note (Signed)
Case Management Note  Patient Details  Name: PANTELIS ELGERSMA MRN: [id number] Date of Birth: 1951/11/22  Subjective/Objective:   66 yr old gentleman s/p left total knee arthroplasty.                 Action/Plan: Case manager spoke with patient and his wife concerning [place] and DME needs at discharge. Choice was offered for [facility]. Referral was called to Stevie Kern, [facility] Liaison. Patient states he has rolling walker and 3in1 from his wife's previous surgery.   Expected Discharge Date:   04/03/17               Expected Discharge Plan:  [place]  In-House Referral:  NA  Discharge planning Services  CM Consult  Post Acute Care Choice:  Home Health Choice offered to:  Patient, Spouse  DME Arranged:  N/A DME Agency:  NA  HH Arranged:  PT HH Agency:  [facility]  Status of Service:  Completed, signed off  If discussed at [place] of Stay Meetings, dates discussed:    Additional Comments:  Ninfa Meeker, RN 04/02/2017, 2:33 PM

## 2017-04-02 NOTE — Progress Notes (Signed)
Orthopedic Tech Progress Note Patient Details:  Bobby Conrad 1951/12/16 [id number]  CPM Left Knee CPM Left Knee: On Left Knee Flexion (Degrees): 90 Left Knee Extension (Degrees): 0 Additional Comments: trapeze bar patient helper   Hildred Priest 04/02/2017, 10:40 AM Viewed order from doctor's order list

## 2017-04-02 NOTE — Anesthesia Postprocedure Evaluation (Addendum)
Anesthesia Post Note  Patient: Bobby Conrad  Procedure(s) Performed: Procedure(s) (LRB): TOTAL KNEE ARTHROPLASTY (Left)  Patient location during evaluation: PACU Anesthesia Type: General Level of consciousness: awake Pain management: pain level controlled Vital Signs Assessment: post-procedure vital signs reviewed and stable Respiratory status: spontaneous breathing Cardiovascular status: stable Postop Assessment: no signs of nausea or vomiting Anesthetic complications: no        Last Vitals:  Vitals:   04/02/17 1000 04/02/17 1011  BP:  (!) 160/93  Pulse: 93 89  Resp: 14 11  Temp:      Last Pain:  Vitals:   04/02/17 1010  TempSrc:   PainSc: 0-No pain   Pain Goal: Patients Stated Pain Goal: 3 (04/02/17 0552)               Eun Vermeer JR,JOHN [place] [facility]

## 2017-04-02 NOTE — H&P (View-Only) (Signed)
TOTAL KNEE ADMISSION H&P  Patient is being admitted for left total knee arthroplasty.  Subjective:  Chief Complaint:left knee pain.  HPI: Bobby Conrad, 66 y.o. male, has a history of pain and functional disability in the left knee due to arthritis and has failed non-surgical conservative treatments for greater than 12 weeks to includeNSAID's and/or analgesics, corticosteriod injections and activity modification.  Onset of symptoms was gradual, starting 5 years ago with gradually worsening course since that time. The patient noted no past surgery on the left knee(s).  Patient currently rates pain in the left knee(s) at 8 out of 10 with activity. Patient has night pain, worsening of pain with activity and weight bearing, pain that interferes with activities of daily living, pain with passive range of motion, crepitus and joint swelling.  Patient has evidence of periarticular osteophytes and joint space narrowing by imaging studies. There is no active infection.  Patient Active Problem List   Diagnosis Date Noted  . 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 Anemia   . Torn meniscus     Past Surgical History:  Procedure Laterality Date  . APPENDECTOMY     1960s  . CARPAL TUNNEL RELEASE    . HERNIA REPAIR     1950's  . KNEE ARTHROSCOPY Left 1990s  . LAPAROSCOPIC NISSEN FUNDOPLICATION N/A 4/76/5465   Procedure: LAPAROSCOPIC NISSEN FUNDOPLICATION;  Surgeon: Greer Pickerel, MD;  Location: [facility];  Service: General;  Laterality: N/A;     (Not in a hospital admission) No Known Allergies  Social History  Substance Use Topics  . Smoking status: Never Smoker  . Smokeless tobacco: Never Used  . Alcohol use Yes     Comment: once or twice a year    No family history on file.   Review of Systems  HENT: Positive for hearing loss.   Genitourinary: Positive for hematuria.  Musculoskeletal: Positive for joint pain.  All other systems reviewed  and are negative.   Objective:  Physical Exam  Constitutional: He is oriented to person, place, and time. He appears well-developed and well-nourished. No distress.  HENT:  Head: Normocephalic and atraumatic.  Nose: Nose normal.  Eyes: Conjunctivae and EOM are normal. Pupils are equal, round, and reactive to light.  Neck: Normal range of motion. Neck supple.  Cardiovascular: Normal rate, regular rhythm, normal heart sounds and intact distal pulses.   Respiratory: Effort normal and breath sounds normal. No respiratory distress. He has no wheezes.  GI: Soft. Bowel sounds are normal. He exhibits no distension. There is no tenderness.  Musculoskeletal:       Left knee: He exhibits swelling and deformity. He exhibits no erythema. Tenderness found.  Lymphadenopathy:    He has no cervical adenopathy.  Neurological: He is alert and oriented to person, place, and time. No cranial nerve deficit.  Skin: Skin is warm and dry. No rash noted. No erythema.  Psychiatric: He has a normal mood and affect. His behavior is normal.    Vital signs in last 24 hours: @VSRANGES @  Labs:   Estimated body mass index is 33.36 kg/m as calculated from the following:   Height as of 01/19/17: 5\' 7"  (1.702 m).   Weight as of 01/19/17: 96.6 kg (213 lb).   Imaging Review Plain radiographs demonstrate moderate degenerative joint disease of the left knee(s). The overall alignment ismild varus. The bone quality appears to be good for age and reported activity level.  Assessment/Plan:  End stage  arthritis, left knee   The patient history, physical examination, clinical judgment of the provider and imaging studies are consistent with end stage degenerative joint disease of the left knee(s) and total knee arthroplasty is deemed medically necessary. The treatment options including medical management, injection therapy arthroscopy and arthroplasty were discussed at length. The risks and benefits of total knee  arthroplasty were presented and reviewed. The risks due to aseptic loosening, infection, stiffness, patella tracking problems, thromboembolic complications and other imponderables were discussed. The patient acknowledged the explanation, agreed to proceed with the plan and consent was signed. Patient is being admitted for inpatient treatment for surgery, pain control, PT, OT, prophylactic antibiotics, VTE prophylaxis, progressive ambulation and ADL's and discharge planning. The patient is planning to be discharged home with home health services

## 2017-04-02 NOTE — Anesthesia Procedure Notes (Addendum)
Anesthesia Regional Block: Adductor canal block   Pre-Anesthetic Checklist: ,, timeout performed, Correct Patient, Correct Site, Correct Laterality, Correct Procedure, Correct Position, site marked, Risks and benefits discussed,  Surgical consent,  Pre-op evaluation,  At surgeon's request and post-op pain management  Laterality: Left and Upper  Prep: chloraprep       Needles:  Injection technique: Single-shot  Needle Type: Echogenic Stimulator Needle     Needle Length: 9cm  Needle Gauge: 21   Needle insertion depth: 4 cm   Additional Needles:   Procedures: ultrasound guided,,,,,,,,  Narrative:  Start time: 04/02/2017 7:12 AM End time: 04/02/2017 7:23 AM Injection made incrementally with aspirations every 5 mL.  Performed by: Personally  Anesthesiologist: Lyn Hollingshead

## 2017-04-02 NOTE — Care Management Obs Status (Signed)
[facility] NOTIFICATION   Patient Details  Name: Bobby Conrad MRN: [id number] Date of Birth: 06/14/1951   Medicare Observation Status Notification Given:  Yes    Ninfa Meeker, RN 04/02/2017, 2:03 PM

## 2017-04-02 NOTE — Discharge Instructions (Signed)

## 2017-04-02 NOTE — Anesthesia Preprocedure Evaluation (Signed)
Anesthesia Evaluation  Patient identified by MRN, date of birth, ID band Patient awake    Reviewed: Allergy & Precautions, NPO status , Patient's Chart, lab work & pertinent test results  Airway Mallampati: I       Dental no notable dental hx.    Pulmonary neg pulmonary ROS,    Pulmonary exam normal        Cardiovascular hypertension, Pt. on medications Normal cardiovascular exam     Neuro/Psych negative psych ROS   GI/Hepatic Neg liver ROS, GERD  Medicated and Controlled,  Endo/Other    Renal/GU negative Renal ROS  negative genitourinary   Musculoskeletal   Abdominal (+) + obese,   Peds  Hematology   Anesthesia Other Findings   Reproductive/Obstetrics                             Anesthesia Physical Anesthesia Plan  ASA: II  Anesthesia Plan:    Post-op Pain Management:  Regional for Post-op pain   Induction:   Airway Management Planned:   Additional Equipment:   Intra-op Plan:   Post-operative Plan:   Informed Consent: I have reviewed the patients History and Physical, chart, labs and discussed the procedure including the risks, benefits and alternatives for the proposed anesthesia with the patient or authorized representative who has indicated his/her understanding and acceptance.     Plan Discussed with:   Anesthesia Plan Comments:         Anesthesia Quick Evaluation

## 2017-04-03 DIAGNOSIS — Z96652 Presence of left artificial knee joint: Secondary | ICD-10-CM | POA: Diagnosis not present

## 2017-04-03 DIAGNOSIS — M1712 Unilateral primary osteoarthritis, left knee: Secondary | ICD-10-CM | POA: Diagnosis not present

## 2017-04-03 LAB — BASIC METABOLIC PANEL
Anion gap: 10 (ref 5–15)
BUN: 6 mg/dL (ref 6–20)
CALCIUM: 8.6 mg/dL — AB (ref 8.9–10.3)
CO2: 30 mmol/L (ref 22–32)
CREATININE: 0.88 mg/dL (ref 0.61–1.24)
Chloride: 100 mmol/L — ABNORMAL LOW (ref 101–111)
GLUCOSE: 113 mg/dL — AB (ref 65–99)
Potassium: 3.4 mmol/L — ABNORMAL LOW (ref 3.5–5.1)
Sodium: 140 mmol/L (ref 135–145)

## 2017-04-03 LAB — CBC
HCT: 39.2 % (ref 39.0–52.0)
Hemoglobin: 12.8 g/dL — ABNORMAL LOW (ref 13.0–17.0)
MCH: 29.4 pg (ref 26.0–34.0)
MCHC: 32.7 g/dL (ref 30.0–36.0)
MCV: 89.9 fL (ref 78.0–100.0)
PLATELETS: 195 10*3/uL (ref 150–400)
RBC: 4.36 MIL/uL (ref 4.22–5.81)
RDW: 12.9 % (ref 11.5–15.5)
WBC: 8.2 10*3/uL (ref 4.0–10.5)

## 2017-04-03 NOTE — Progress Notes (Signed)
Pt ready for discharge. Education/instructions reviewed with pt and spouse, and all questions/concerns addressed. IV removed, prescriptions/aquacel dressing given, and belongings gathered. Pt will be transported out via wheelchair to wife's vehicle. Will continue to monitor

## 2017-04-03 NOTE — Progress Notes (Signed)
qPhysical Therapy Treatment Patient Details Name: Bobby Conrad MRN: [id number] DOB: 1951-05-29 Today's Date: 04/03/2017    History of Present Illness 66 y.o. male, s/p left total knee arthroplasty.     PT Comments    Pt is POD 1 and continues to be moving well with therapy, limited slightly by pain this session. Performed gait x 150' and performed stair negotiation this session. Pain improved as gait distance increases. Pt continues to benefit from HHPT at discharge in order to maximize functional outcomes. Pt's wife is PCG and had 2 knees replaced in the past year. Pt is well aware of HEP and was performing exercises prior to admission.     Follow Up Recommendations  Home health PT     Equipment Recommendations  None recommended by PT    Recommendations for Other Services       Precautions / Restrictions Precautions Precautions: Knee Precaution Booklet Issued: Yes (comment) Precaution Comments: reviewed Required Braces or Orthoses: Knee Immobilizer - Left (first 24 hours) Knee Immobilizer - Left: Other (comment) (24 hours post op) Restrictions Weight Bearing Restrictions: Yes LLE Weight Bearing: Weight bearing as tolerated    Mobility  Bed Mobility               General bed mobility comments: OOB in recliner when PT arrived  Transfers Overall transfer level: Needs assistance Equipment used: Rolling walker (2 wheeled) Transfers: Sit to/from Stand Sit to Stand: Supervision         General transfer comment: supervision for safety. VC for hand placement. Good power-up to stand, and good control with descent to chair.  Ambulation/Gait Ambulation/Gait assistance: Min guard Ambulation Distance (Feet): 150 Feet Assistive device: Rolling walker (2 wheeled) Gait Pattern/deviations: Step-to pattern;Decreased step length - right;Decreased stance time - left;Decreased stride length;Decreased weight shift to left;Antalgic Gait velocity: decreased Gait velocity  interpretation: Below normal speed for age/gender General Gait Details: cues for sequencing, improved heel strike and increased c/o pain with weight bearing through LLE during stance this session   Stairs            Wheelchair Mobility    Modified Rankin (Stroke Patients Only)       Balance Overall balance assessment: Needs assistance Sitting-balance support: No upper extremity supported;Feet supported Sitting balance-Leahy Scale: Normal     Standing balance support: No upper extremity supported Standing balance-Leahy Scale: Fair                              Cognition Arousal/Alertness: Awake/alert Behavior During Therapy: WFL for tasks assessed/performed Overall Cognitive Status: Within Functional Limits for tasks assessed                                        Exercises Total Joint Exercises Ankle Circles/Pumps: AROM;Both;20 reps;Supine Quad Sets: AROM;Left;10 reps;Seated Heel Slides: AAROM;Left;10 reps;Supine Goniometric ROM: 1-65    General Comments        Pertinent Vitals/Pain Pain Assessment: 0-10 Pain Score: 5  Pain Location: left knee with mobility Pain Descriptors / Indicators: Aching;Guarding Pain Intervention(s): Monitored during session;Premedicated before session;Repositioned;Ice applied    Home Living                      Prior Function            PT Goals (current goals can now be  found in the care plan section) Acute Rehab PT Goals Patient Stated Goal: Go home today Progress towards PT goals: Progressing toward goals    Frequency    7X/week      PT Plan Current plan remains appropriate    Co-evaluation             End of Session Equipment Utilized During Treatment: Gait belt;Left knee immobilizer Activity Tolerance: Patient tolerated treatment well Patient left: in chair;with call bell/phone within reach Nurse Communication: Mobility status;Other (comment) (pt ready for DC once  seen by OT) PT Visit Diagnosis: Difficulty in walking, not elsewhere classified (R26.2);Muscle weakness (generalized) (M62.81)     Time: 5427-0623 PT Time Calculation (min) (ACUTE ONLY): 34 min  Charges:  $Gait Training: 8-22 mins $Therapeutic Exercise: 8-22 mins                    G Codes:       Scheryl Marten PT, DPT  [phone]    Jacqulyn Liner Sloan Leiter 04/03/2017, 8:54 AM

## 2017-04-03 NOTE — Progress Notes (Addendum)
   Assessment: 1 Day Post-Op  S/P Procedure(s) (LRB): TOTAL KNEE ARTHROPLASTY (Left) by Dr. French Ana on 04/02/17  Active Problems:   Primary localized osteoarthritis of left knee  Acute Blood Loss Anemia, mild, asymptomatic.   Plan: Up with therapy - will work on stairs today.  He has about 4 into the house. D/C IV fluids  Weight Bearing: Weight Bearing as Tolerated (WBAT)  Dressings: Provide Aquacel dressing at discharge if he does go home later today. VTE prophylaxis: Eliquis, SCDs, ambulation Dispo: Home  w/ HHPT likely later today if cleared by PT  Subjective: Patient reports pain as moderate. Pain controlled with PO meds.  Tolerating diet.  Urinating.  +Flatus.  No CP, SOB.  OOB in hall with PT - 90 ft.  Hoping to go home today.  Objective:   VITALS:   Vitals:   04/02/17 1449 04/02/17 2020 04/02/17 2354 04/03/17 0530  BP: (!) 157/91 119/67 (!) 150/78 (!) 146/84  Pulse: 70 76 74 70  Resp: 18     Temp: 98 F (36.7 C) 98.3 F (36.8 C) 99.1 F (37.3 C) 98.6 F (37 C)  TempSrc: Oral Oral Oral Oral  SpO2: 98% 99% 98% 97%  Weight:       CBC Latest Ref Rng & Units 04/03/2017 03/22/2017 01/20/2017  WBC 4.0 - 10.5 K/uL 8.2 5.4 13.6(H)  Hemoglobin 13.0 - 17.0 g/dL 12.8(L) 14.1 13.7  Hematocrit 39.0 - 52.0 % 39.2 42.1 40.6  Platelets 150 - 400 K/uL 195 224 236   BMP Latest Ref Rng & Units 04/03/2017 03/22/2017 01/20/2017  Glucose 65 - 99 mg/dL 113(H) 99 116(H)  BUN 6 - 20 mg/dL 6 10 12   Creatinine 0.61 - 1.24 mg/dL 0.88 0.83 0.88  Sodium 135 - 145 mmol/L 140 141 140  Potassium 3.5 - 5.1 mmol/L 3.4(L) 3.7 4.0  Chloride 101 - 111 mmol/L 100(L) 100(L) 102  CO2 22 - 32 mmol/L 30 33(H) 29  Calcium 8.9 - 10.3 mg/dL 8.6(L) 9.5 8.8(L)   Intake/Output      04/06 0701 - 04/07 0700 04/07 0701 - 04/08 0700   P.O. 240    I.V. (mL/kg) 1200 (13.4)    Total Intake(mL/kg) 1440 (16.1)    Urine (mL/kg/hr) 1050 (0.5)    Blood 40 (0)    Total Output 1090     Net +350          Urine  Occurrence 1 x      Physical Exam: General: NAD.  Upright in chair eating breakfast Resp: No increased wob Cardio: regular rate and rhythm ABD soft Neurologically intact MSK Neurovascularly intact Sensation intact distally Intact pulses distally Dorsiflexion/Plantar flexion intact Incision: dressing C/D/I  Prudencio Burly III, PA-C 04/03/2017, 7:51 AM

## 2017-04-03 NOTE — Evaluation (Signed)
Occupational Therapy Evaluation and Discharge  Patient Details Name: Bobby Conrad MRN: [id number] DOB: May 25, 1951 Today's Date: 04/03/2017    History of Present Illness 66 y.o. male, s/p left total knee arthroplasty.    Clinical Impression   Pt reports he was independent with ADL PTA. Currently pt supervision for ADL and functional mobility with the exception of min assist for LB ADL. All knee, safety, and ADL education complete with pt; he verbalized understanding. Pt planning to d/c home with 24/7 supervision from family. No further acute OT needs identified; signing off at this time. Please re-consult if needs change. Thank you for this referral.    Follow Up Recommendations  No OT follow up;Supervision - Intermittent    Equipment Recommendations  None recommended by OT    Recommendations for Other Services       Precautions / Restrictions Precautions Precautions: Knee Precaution Booklet Issued: No Precaution Comments: Educated pt on no pillow under knee and use of zero degree knee Required Braces or Orthoses: Knee Immobilizer - Left (first 24 hours) Knee Immobilizer - Left: Other (comment) (24 hours post op) Restrictions Weight Bearing Restrictions: Yes LLE Weight Bearing: Weight bearing as tolerated      Mobility Bed Mobility               General bed mobility comments: Pt OOB in chair upon arrival  Transfers Overall transfer level: Needs assistance Equipment used: Rolling walker (2 wheeled) Transfers: Sit to/from Stand Sit to Stand: Supervision         General transfer comment: Good hand placement and technique. Supervision for safety but quickly approaching mod I.    Balance Overall balance assessment: Needs assistance Sitting-balance support: Feet supported;No upper extremity supported Sitting balance-Leahy Scale: Normal     Standing balance support: No upper extremity supported;During functional activity Standing balance-Leahy Scale: Fair                              ADL either performed or assessed with clinical judgement   ADL Overall ADL's : Needs assistance/impaired Eating/Feeding: Independent;Sitting   Grooming: Supervision/safety;Standing   Upper Body Bathing: Set up;Sitting   Lower Body Bathing: Minimal assistance;Sit to/from stand   Upper Body Dressing : Set up;Sitting   Lower Body Dressing: Minimal assistance;Sit to/from stand Lower Body Dressing Details (indicate cue type and reason): Educated on L leg first into clothing. Wife to assist as needed Toilet Transfer: Supervision/safety;Ambulation;RW;Comfort height toilet   Toileting- Clothing Manipulation and Hygiene: Supervision/safety;Sit to/from stand   Tub/ Shower Transfer: Supervision/safety;Walk-in shower;Ambulation;Shower Technical sales engineer Details (indicate cue type and reason): Simulated; pt with good technique Functional mobility during ADLs: Supervision/safety;Rolling walker       Vision         Perception     Praxis      Pertinent Vitals/Pain Pain: No, denies pain     Hand Dominance Right   Extremity/Trunk Assessment Upper Extremity Assessment Upper Extremity Assessment: Overall WFL for tasks assessed   Lower Extremity Assessment Lower Extremity Assessment: Defer to PT evaluation   Cervical / Trunk Assessment Cervical / Trunk Assessment: Normal   Communication Communication Communication: No difficulties   Cognition Arousal/Alertness: Awake/alert Behavior During Therapy: WFL for tasks assessed/performed Overall Cognitive Status: Within Functional Limits for tasks assessed  General Comments       Exercises    Shoulder Instructions      Home Living Family/patient expects to be discharged to:: Private residence Living Arrangements: Spouse/significant other Available Help at Discharge: Family;Available 24 hours/day Type of Home:  House Home Access: Stairs to enter [employer] of Steps: 5 Entrance Stairs-Rails: Right Home Layout: One level     Bathroom Shower/Tub: Occupational psychologist: Handicapped height     Home Equipment: Environmental consultant - 2 wheels;Bedside commode;Shower seat - built in;Grab bars - tub/shower          Prior Functioning/Environment Level of Independence: Independent                 OT Problem List:        OT Treatment/Interventions:      OT Goals(Current goals can be found in the care plan section) Acute Rehab OT Goals Patient Stated Goal: Go home today OT Goal Formulation: All assessment and education complete, DC therapy  OT Frequency:     Barriers to D/C:            Co-evaluation              End of Session CPM Left Knee CPM Left Knee: Off  Activity Tolerance:   Patient left:    OT Visit Diagnosis: Other abnormalities of gait and mobility (R26.89)                Time: 1314-3888 OT Time Calculation (min): 18 min Charges:  OT General Charges $OT Visit: 1 Procedure OT Evaluation $OT Eval Moderate Complexity: 1 Procedure G-Codes: OT G-codes **NOT FOR INPATIENT CLASS** Functional Assessment Tool Used: Clinical judgement Functional Limitation: Self care Self Care Current Status (L5797): At least 1 percent but less than 20 percent impaired, limited or restricted Self Care Goal Status (K8206): At least 1 percent but less than 20 percent impaired, limited or restricted Self Care Discharge Status [phone]): At least 1 percent but less than 20 percent impaired, limited or restricted   Mel Almond A. Ulice Brilliant, M.S., OTR/L Pager: [place] 04/03/2017, 9:17 AM

## 2017-04-03 NOTE — Discharge Summary (Signed)
Discharge Summary  Patient ID: Bobby Conrad MRN: [id number] DOB/AGE: 27-Nov-1951 66 y.o.  Admit date: 04/02/2017 Discharge date: 04/03/2017  Admission Diagnoses:  Primary localized osteoarthritis of left knee  Discharge Diagnoses:  Principal Problem:   Primary localized osteoarthritis of left knee   Past Medical History:  Diagnosis Date  . Anemia   . GERD (gastroesophageal reflux disease)   . Hypertension   . Torn meniscus     Surgeries: Procedure(s): TOTAL KNEE ARTHROPLASTY on 04/02/2017   Consultants (if any):   Discharged Condition: Improved  Hospital Course: Bobby Conrad is an 66 y.o. male who was admitted 04/02/2017 with a diagnosis of Primary localized osteoarthritis of left knee and went to the operating room on 04/02/2017 and underwent the above named procedures.    He was given perioperative antibiotics:  Anti-infectives    Start     Dose/Rate Route Frequency Ordered Stop   04/02/17 1330  ceFAZolin (ANCEF) IVPB 1 g/50 mL premix     1 g 100 mL/hr over 30 Minutes Intravenous Every 6 hours 04/02/17 1101 04/02/17 2057   04/02/17 0700  ceFAZolin (ANCEF) IVPB 2g/100 mL premix     2 g 200 mL/hr over 30 Minutes Intravenous To [facility] 04/01/17 0848 04/02/17 0743    .  He was given sequential compression devices, early ambulation, and Eliquis for DVT prophylaxis.  He benefited maximally from the hospital stay and there were no complications.    Recent vital signs:  Vitals:   04/02/17 2354 04/03/17 0530  BP: (!) 150/78 (!) 146/84  Pulse: 74 70  Resp:    Temp: 99.1 F (37.3 C) 98.6 F (37 C)    Recent laboratory studies:  Lab Results  Component Value Date   HGB 12.8 (L) 04/03/2017   HGB 14.1 03/22/2017   HGB 13.7 01/20/2017   Lab Results  Component Value Date   WBC 8.2 04/03/2017   PLT 195 04/03/2017   Lab Results  Component Value Date   INR 1.00 03/22/2017   Lab Results  Component Value Date   NA 140 04/03/2017   K 3.4 (L)  04/03/2017   CL 100 (L) 04/03/2017   CO2 30 04/03/2017   BUN 6 04/03/2017   CREATININE 0.88 04/03/2017   GLUCOSE 113 (H) 04/03/2017    Discharge Medications:   Allergies as of 04/03/2017      Reactions   No Known Allergies       Medication List    STOP taking these medications   ondansetron 4 MG tablet Commonly known as:  ZOFRAN   pantoprazole 40 MG tablet Commonly known as:  PROTONIX     TAKE these medications   apixaban 2.5 MG Tabs tablet Commonly known as:  ELIQUIS Take 1 tablet (2.5 mg total) by mouth 2 (two) times daily.   hydrochlorothiazide 12.5 MG tablet Commonly known as:  HYDRODIURIL Take 12.5 mg by mouth daily.   HYDROcodone-acetaminophen 5-325 MG tablet Commonly known as:  NORCO Take 1-2 tablets by mouth every 4 (four) hours as needed for severe pain.   traMADol 50 MG tablet Commonly known as:  ULTRAM Take 2 tablets (100 mg total) by mouth every 6 (six) hours as needed for moderate pain. What changed:  how much to take       Diagnostic Studies: No results found.  Disposition: 01-Home or Self Care    Follow-up Information    CAFFREY JR,W D, MD. Schedule an appointment as soon as possible for a visit in 2  week(s).   Specialty:  Orthopedic Surgery Contact information: [address] [place] [address] [phone]        [facility] Follow up.   Why:  A representative from [place] will contact you to arrange start date and time for your therapy.   Contact information: [address] [place] [address] [phone]           Signed: Prudencio Burly III PA-C 04/03/2017, 7:58 AM

## 2017-04-05 ENCOUNTER — Encounter (HOSPITAL_COMMUNITY): Payer: Self-pay | Admitting: Orthopedic Surgery

## 2017-04-05 DIAGNOSIS — Z96652 Presence of left artificial knee joint: Secondary | ICD-10-CM | POA: Diagnosis not present

## 2017-04-05 DIAGNOSIS — D649 Anemia, unspecified: Secondary | ICD-10-CM | POA: Diagnosis not present

## 2017-04-05 DIAGNOSIS — Z79891 Long term (current) use of opiate analgesic: Secondary | ICD-10-CM | POA: Diagnosis not present

## 2017-04-05 DIAGNOSIS — Z471 Aftercare following joint replacement surgery: Secondary | ICD-10-CM | POA: Diagnosis not present

## 2017-04-07 ENCOUNTER — Ambulatory Visit (HOSPITAL_COMMUNITY)
Admission: RE | Admit: 2017-04-07 | Discharge: 2017-04-07 | Disposition: A | Discharge status: Home / Self Care | Payer: PPO | Source: Ambulatory Visit | Attending: Orthopedic Surgery | Admitting: Orthopedic Surgery

## 2017-04-07 ENCOUNTER — Other Ambulatory Visit (HOSPITAL_COMMUNITY): Payer: Self-pay | Admitting: Orthopedic Surgery

## 2017-04-07 ENCOUNTER — Encounter (HOSPITAL_COMMUNITY): Payer: Self-pay

## 2017-04-07 DIAGNOSIS — M79605 Pain in left leg: Secondary | ICD-10-CM

## 2017-04-07 DIAGNOSIS — M79606 Pain in leg, unspecified: Secondary | ICD-10-CM

## 2017-04-07 DIAGNOSIS — M7989 Other specified soft tissue disorders: Principal | ICD-10-CM

## 2017-04-07 DIAGNOSIS — I82442 Acute embolism and thrombosis of left tibial vein: Secondary | ICD-10-CM | POA: Diagnosis not present

## 2017-04-07 NOTE — Progress Notes (Signed)
Preliminary results by tech - Left Lower Ext. Venous Duplex repeated. Positive for acute deep vein thrombosis involving one of the paired peroneal veins in the proximal and mid segment and partial thrombus in the proximal posterior tibial veins. Results given to [place], [place].  Oda Cogan, BS, RDMS, RVT

## 2017-04-07 NOTE — Progress Notes (Signed)
**  Preliminary report by tech**  Left lower extremity venous duplex complete. There is no obvious evidence of deep or superficial vein thrombosis involving the left lower extremity. All clearly visualized vessels appear patent and compressible. There is no evidence of a Baker's cyst on the left. Results were given to [facility] at Dr. Alvester Morin office.  04/07/17 2:54 PM Bobby Conrad RVT

## 2017-04-08 ENCOUNTER — Encounter (HOSPITAL_COMMUNITY): Payer: PPO

## 2017-04-12 ENCOUNTER — Ambulatory Visit (HOSPITAL_BASED_OUTPATIENT_CLINIC_OR_DEPARTMENT_OTHER)
Admit: 2017-04-12 | Discharge: 2017-04-12 | Disposition: A | Discharge status: Home / Self Care | Payer: PPO | Attending: Emergency Medicine | Admitting: Emergency Medicine

## 2017-04-12 ENCOUNTER — Emergency Department (HOSPITAL_COMMUNITY)
Admission: EM | Admit: 2017-04-12 | Discharge: 2017-04-12 | Disposition: A | Discharge status: Home / Self Care | Payer: PPO | Attending: Emergency Medicine | Admitting: Emergency Medicine

## 2017-04-12 ENCOUNTER — Emergency Department (HOSPITAL_COMMUNITY): Payer: PPO

## 2017-04-12 ENCOUNTER — Encounter (HOSPITAL_COMMUNITY): Payer: Self-pay | Admitting: Radiology

## 2017-04-12 DIAGNOSIS — M25562 Pain in left knee: Secondary | ICD-10-CM | POA: Diagnosis not present

## 2017-04-12 DIAGNOSIS — I10 Essential (primary) hypertension: Secondary | ICD-10-CM | POA: Diagnosis not present

## 2017-04-12 DIAGNOSIS — M7989 Other specified soft tissue disorders: Secondary | ICD-10-CM

## 2017-04-12 DIAGNOSIS — R0602 Shortness of breath: Secondary | ICD-10-CM | POA: Diagnosis not present

## 2017-04-12 DIAGNOSIS — M79609 Pain in unspecified limb: Secondary | ICD-10-CM | POA: Diagnosis not present

## 2017-04-12 DIAGNOSIS — Z96652 Presence of left artificial knee joint: Secondary | ICD-10-CM | POA: Diagnosis not present

## 2017-04-12 DIAGNOSIS — R9431 Abnormal electrocardiogram [ECG] [EKG]: Secondary | ICD-10-CM | POA: Diagnosis not present

## 2017-04-12 DIAGNOSIS — Z79899 Other long term (current) drug therapy: Secondary | ICD-10-CM | POA: Diagnosis not present

## 2017-04-12 DIAGNOSIS — R030 Elevated blood-pressure reading, without diagnosis of hypertension: Secondary | ICD-10-CM | POA: Diagnosis not present

## 2017-04-12 DIAGNOSIS — Z7901 Long term (current) use of anticoagulants: Secondary | ICD-10-CM | POA: Diagnosis not present

## 2017-04-12 LAB — CBC WITH DIFFERENTIAL/PLATELET
BASOS ABS: 0 10*3/uL (ref 0.0–0.1)
BASOS PCT: 0 %
EOS ABS: 0.1 10*3/uL (ref 0.0–0.7)
EOS PCT: 1 %
HCT: 40 % (ref 39.0–52.0)
Hemoglobin: 13.4 g/dL (ref 13.0–17.0)
Lymphocytes Relative: 16 %
Lymphs Abs: 1.4 10*3/uL (ref 0.7–4.0)
MCH: 30 pg (ref 26.0–34.0)
MCHC: 33.5 g/dL (ref 30.0–36.0)
MCV: 89.5 fL (ref 78.0–100.0)
MONO ABS: 0.4 10*3/uL (ref 0.1–1.0)
Monocytes Relative: 5 %
Neutro Abs: 6.8 10*3/uL (ref 1.7–7.7)
Neutrophils Relative %: 78 %
PLATELETS: 405 10*3/uL — AB (ref 150–400)
RBC: 4.47 MIL/uL (ref 4.22–5.81)
RDW: 13.4 % (ref 11.5–15.5)
WBC: 8.7 10*3/uL (ref 4.0–10.5)

## 2017-04-12 LAB — COMPREHENSIVE METABOLIC PANEL
ALBUMIN: 3.9 g/dL (ref 3.5–5.0)
ALT: 44 U/L (ref 17–63)
AST: 40 U/L (ref 15–41)
Alkaline Phosphatase: 109 U/L (ref 38–126)
Anion gap: 13 (ref 5–15)
BUN: 16 mg/dL (ref 6–20)
CHLORIDE: 103 mmol/L (ref 101–111)
CO2: 22 mmol/L (ref 22–32)
Calcium: 9.3 mg/dL (ref 8.9–10.3)
Creatinine, Ser: 0.85 mg/dL (ref 0.61–1.24)
GFR calc Af Amer: >60 mL/min (ref >60)
Glucose, Bld: 110 mg/dL — ABNORMAL HIGH (ref 65–99)
POTASSIUM: 3.7 mmol/L (ref 3.5–5.1)
Sodium: 138 mmol/L (ref 135–145)
Total Bilirubin: 1.6 mg/dL — ABNORMAL HIGH (ref 0.3–1.2)
Total Protein: 7.4 g/dL (ref 6.5–8.1)

## 2017-04-12 LAB — I-STAT TROPONIN, ED: Troponin i, poc: 0 ng/mL (ref 0.00–0.08)

## 2017-04-12 LAB — I-STAT CHEM 8, ED
BUN: 16 mg/dL (ref 6–20)
CALCIUM ION: 1.08 mmol/L — AB (ref 1.15–1.40)
CHLORIDE: 105 mmol/L (ref 101–111)
Creatinine, Ser: 0.7 mg/dL (ref 0.61–1.24)
Glucose, Bld: 115 mg/dL — ABNORMAL HIGH (ref 65–99)
HCT: 40 % (ref 39.0–52.0)
HEMOGLOBIN: 13.6 g/dL (ref 13.0–17.0)
Potassium: 3.7 mmol/L (ref 3.5–5.1)
SODIUM: 138 mmol/L (ref 135–145)
TCO2: 23 mmol/L (ref 0–100)

## 2017-04-12 MED ORDER — SODIUM CHLORIDE 0.9 % IV BOLUS (SEPSIS)
1000.0000 mL | Freq: Once | INTRAVENOUS | Status: AC
  Administered 2017-04-12: 1000 mL via INTRAVENOUS

## 2017-04-12 MED ORDER — HYDROMORPHONE HCL 1 MG/ML IJ SOLN
1.0000 mg | Freq: Once | INTRAMUSCULAR | Status: DC
Start: 2017-04-12 — End: 2017-04-12

## 2017-04-12 MED ORDER — HYDROMORPHONE HCL 1 MG/ML IJ SOLN
1.0000 mg | Freq: Once | INTRAMUSCULAR | Status: AC
  Administered 2017-04-12: 1 mg via INTRAVENOUS
  Filled 2017-04-12: qty 1

## 2017-04-12 MED ORDER — IOPAMIDOL (ISOVUE-370) INJECTION 76%
INTRAVENOUS | Status: AC
  Administered 2017-04-12: 100 mL
  Filled 2017-04-12: qty 100

## 2017-04-12 MED ORDER — HYDROMORPHONE HCL 1 MG/ML IJ SOLN
0.5000 mg | Freq: Once | INTRAMUSCULAR | Status: AC
Start: 2017-04-12 — End: 2017-04-12
  Administered 2017-04-12: 0.5 mg via INTRAVENOUS
  Filled 2017-04-12: qty 1

## 2017-04-12 MED ORDER — ONDANSETRON HCL 4 MG/2ML IJ SOLN
4.0000 mg | Freq: Once | INTRAMUSCULAR | Status: AC
Start: 2017-04-12 — End: 2017-04-12
  Administered 2017-04-12: 4 mg via INTRAVENOUS
  Filled 2017-04-12: qty 2

## 2017-04-12 NOTE — Progress Notes (Signed)
VASCULAR LAB PRELIMINARY  PRELIMINARY  PRELIMINARY  PRELIMINARY  Left lower extremity venous duplex completed.    Preliminary report:  The DVT found 04/07/17 in the left peroneal and posterior tibial veins, remains, with no propagation noted.  Called results to Deno Etienne, MD  Sharion Dove, RVT 04/12/2017, 2:32 PM

## 2017-04-12 NOTE — ED Triage Notes (Signed)
Patient from home with [facility] for left calf pain.  Patient had knee replacement surgery on 04/02/17, then was seen 4/13 for severe left calf pain.  Diagnosed then with blood clots and discharged home with anticoagulant.  Patient states calf pain significantly increased this morning.  Patient alert and oriented, tachypnic at this time and having difficulty speaking complete sentences.

## 2017-04-12 NOTE — ED Provider Notes (Signed)
[facility] DEPT Provider Note   CSN: [id number] Arrival date & time: 04/12/17  1010     History   Chief Complaint Chief Complaint  Patient presents with  . Leg Pain    HPI Bobby Conrad is a 66 y.o. male.  66 yo M with a chief complaint of left calf and knee pain. Patient had a left total knee done about a week ago. Had been doing okay until about 4 days ago when he started having left calf pain. Was found to have a left DVT. The started on Eliquis. This morning patient had sudden worsening of the pain. Radiates up into his knee. Denies fevers or chills. Denies chest pain or shortness of breath. Denies abdominal pain.   The history is provided by the patient.  Leg Pain   This is a recurrent problem. The current episode started 1 to 2 hours ago. The problem occurs constantly. The pain is present in the left lower leg. The quality of the pain is described as constant. The pain is at a severity of 10/10. The pain is severe. The symptoms are aggravated by contact. He has tried nothing for the symptoms.    Past Medical History:  Diagnosis Date  . Anemia   . GERD (gastroesophageal reflux disease)   . Hypertension   . Torn meniscus     Patient Active Problem List   Diagnosis Date Noted  . Primary localized osteoarthritis of left knee 04/02/2017  . Incarcerated paraesophageal hernia 01/19/2017    Past Surgical History:  Procedure Laterality Date  . APPENDECTOMY     1960s  . CARPAL TUNNEL RELEASE    . HERNIA REPAIR     1950's  . KNEE ARTHROSCOPY Left 1990s  . LAPAROSCOPIC NISSEN FUNDOPLICATION N/A 05/14/6159   Procedure: LAPAROSCOPIC NISSEN FUNDOPLICATION;  Surgeon: Greer Pickerel, MD;  Location: [facility];  Service: General;  Laterality: N/A;  . TOTAL KNEE ARTHROPLASTY Left 04/02/2017   Procedure: TOTAL KNEE ARTHROPLASTY;  Surgeon: Earlie Server, MD;  Location: [facility];  Service: Orthopedics;  Laterality: Left;       Home Medications    Prior to Admission medications    Medication Sig Start Date End Date Taking? Authorizing Provider  hydrochlorothiazide (HYDRODIURIL) 12.5 MG tablet Take 12.5 mg by mouth daily. 12/25/16  Yes Historical Provider, MD  HYDROcodone-acetaminophen (NORCO) 5-325 MG tablet Take 1-2 tablets by mouth every 4 (four) hours as needed for severe pain. 04/02/17  Yes Joshua Chadwell, PA-C  XARELTO 15 MG TABS tablet Take 15 mg by mouth 2 (two) times daily. 04/07/17  Yes Historical Provider, MD  apixaban (ELIQUIS) 2.5 MG TABS tablet Take 1 tablet (2.5 mg total) by mouth 2 (two) times daily. Patient not taking: Reported on 04/12/2017 04/02/17   Chriss Czar, PA-C  traMADol (ULTRAM) 50 MG tablet Take 2 tablets (100 mg total) by mouth every 6 (six) hours as needed for moderate pain. Patient not taking: Reported on 04/12/2017 04/02/17   Chriss Czar, PA-C    Family History No family history on file.  Social History Social History  Substance Use Topics  . Smoking status: Never Smoker  . Smokeless tobacco: Never Used  . Alcohol use Yes     Comment: once or twice a year     Allergies   No known allergies   Review of Systems Review of Systems  Constitutional: Negative for chills and fever.  HENT: Negative for congestion and facial swelling.   Eyes: Negative for discharge and visual disturbance.  Respiratory: Negative for shortness of breath.   Cardiovascular: Positive for leg swelling. Negative for chest pain and palpitations.  Gastrointestinal: Negative for abdominal pain, diarrhea and vomiting.  Musculoskeletal: Positive for arthralgias and myalgias.  Skin: Negative for color change and rash.  Neurological: Negative for tremors, syncope and headaches.  Psychiatric/Behavioral: Negative for confusion and dysphoric mood.     Physical Exam Updated Vital Signs BP (!) 148/92   Pulse 86   Temp 99 F (37.2 C) (Oral)   Resp 12   SpO2 94%   Physical Exam  Constitutional: He is oriented to person, place, and time. He appears  well-developed and well-nourished.  HENT:  Head: Normocephalic and atraumatic.  Eyes: EOM are normal. Pupils are equal, round, and reactive to light.  Neck: Normal range of motion. Neck supple. No JVD present.  Cardiovascular: Normal rate and regular rhythm.  Exam reveals no gallop and no friction rub.   No murmur heard. Pulmonary/Chest: No respiratory distress. He has no wheezes.  Abdominal: He exhibits no distension and no mass. There is no tenderness. There is no rebound and no guarding.  Musculoskeletal: Normal range of motion. He exhibits tenderness (mild about the posterior calf and right anterior aspect of the knee).  Neurological: He is alert and oriented to person, place, and time.  Skin: No rash noted. No pallor.  Psychiatric: He has a normal mood and affect. His behavior is normal.  Nursing note and vitals reviewed.    ED Treatments / Results  Labs (all labs ordered are listed, but only abnormal results are displayed) Labs Reviewed  CBC WITH DIFFERENTIAL/PLATELET - Abnormal; Notable for the following:       Result Value   Platelets 405 (*)    All other components within normal limits  COMPREHENSIVE METABOLIC PANEL - Abnormal; Notable for the following:    Glucose, Bld 110 (*)    Total Bilirubin 1.6 (*)    All other components within normal limits  I-STAT CHEM 8, ED - Abnormal; Notable for the following:    Glucose, Bld 115 (*)    Calcium, Ion 1.08 (*)    All other components within normal limits  I-STAT TROPOININ, ED    EKG  EKG Interpretation None       Radiology Ct Angio Chest Pe W And/or Wo Contrast  Result Date: 04/12/2017 CLINICAL DATA:  Shortness of breath.  Recent DVT. EXAM: CT ANGIOGRAPHY CHEST WITH CONTRAST TECHNIQUE: Multidetector CT imaging of the chest was performed using the standard protocol during bolus administration of intravenous contrast. Multiplanar CT image reconstructions and MIPs were obtained to evaluate the vascular anatomy. CONTRAST:   80 cc of Isovue 370 COMPARISON:  None. FINDINGS: Cardiovascular: Normal heart size. Aortic atherosclerosis noted. Calcification in the LAD coronary artery noted. The main pulmonary artery appears normal. There is no lobar or segmental pulmonary artery filling defects. Mediastinum/Nodes: The trachea appears patent and is midline. Normal appearance of the esophagus. No enlarged mediastinal or hilar adenopathy. Lungs/Pleura: There is no pleural effusion identified. No airspace consolidation or atelectasis identified. Scar noted within the lingula. Upper Abdomen: No acute abnormality. Musculoskeletal: No chest wall abnormality. No acute or significant osseous findings. Review of the MIP images confirms the above findings. IMPRESSION: 1. No acute cardiopulmonary abnormalities and no evidence for acute pulmonary embolus. 2. Aortic atherosclerosis and LAD coronary artery calcification. Electronically Signed   By: Kerby Moors M.D.   On: 04/12/2017 12:21    Procedures Procedures (including critical care time)  Medications Ordered  in ED Medications  ondansetron (ZOFRAN) injection 4 mg (4 mg Intravenous Given 04/12/17 1119)  sodium chloride 0.9 % bolus 1,000 mL (1,000 mLs Intravenous New Bag/Given 04/12/17 1116)  HYDROmorphone (DILAUDID) injection 0.5 mg (0.5 mg Intravenous Given 04/12/17 1118)  iopamidol (ISOVUE-370) 76 % injection (100 mLs  Contrast Given 04/12/17 1140)  HYDROmorphone (DILAUDID) injection 1 mg (1 mg Intravenous Given 04/12/17 1245)     Initial Impression / Assessment and Plan / ED Course  I have reviewed the triage vital signs and the nursing notes.  Pertinent labs & imaging results that were available during my care of the patient were reviewed by me and considered in my medical decision making (see chart for details).     66 yo M with leg pain.  Worst to the left calf goes to the anterior knee.  Seen recently for same dx with dvt, changed from eliquis to [place].  Worsening pain  yesterday.  Warm leg without exudates or erythema. Discussed with Gwyndolyn Saxon, ortho.  Recommended  Repeat DVT study to evaluate for worsening. DVT study with no change. Will have follow-up in the office with orthopedics tomorrow. Patient's pain is well controlled after milligram half of Dilaudid.  3:03 PM:  I have discussed the diagnosis/risks/treatment options with the patient and family and believe the pt to be eligible for discharge home to follow-up with Ortho. We also discussed returning to the ED immediately if new or worsening sx occur. We discussed the sx which are most concerning (e.g., sudden worsening pain, fever, inability to tolerate by mouth) that necessitate immediate return. Medications administered to the patient during their visit and any new prescriptions provided to the patient are listed below.  Medications given during this visit Medications  ondansetron (ZOFRAN) injection 4 mg (4 mg Intravenous Given 04/12/17 1119)  sodium chloride 0.9 % bolus 1,000 mL (1,000 mLs Intravenous New Bag/Given 04/12/17 1116)  HYDROmorphone (DILAUDID) injection 0.5 mg (0.5 mg Intravenous Given 04/12/17 1118)  iopamidol (ISOVUE-370) 76 % injection (100 mLs  Contrast Given 04/12/17 1140)  HYDROmorphone (DILAUDID) injection 1 mg (1 mg Intravenous Given 04/12/17 1245)     The patient appears reasonably screen and/or stabilized for discharge and I doubt any other medical condition or other [facility] requiring further screening, evaluation, or treatment in the ED at this time prior to discharge.    Final Clinical Impressions(s) / ED Diagnoses   Final diagnoses:  Acute pain of left knee    New Prescriptions New Prescriptions   No medications on file     Deno Etienne, DO 04/12/17 1503

## 2017-04-12 NOTE — ED Notes (Signed)
Pt going to CT

## 2017-04-13 DIAGNOSIS — M1712 Unilateral primary osteoarthritis, left knee: Secondary | ICD-10-CM | POA: Diagnosis not present

## 2017-04-14 DIAGNOSIS — Z96652 Presence of left artificial knee joint: Secondary | ICD-10-CM | POA: Diagnosis not present

## 2017-04-14 DIAGNOSIS — Z471 Aftercare following joint replacement surgery: Secondary | ICD-10-CM | POA: Diagnosis not present

## 2017-04-14 DIAGNOSIS — Z79891 Long term (current) use of opiate analgesic: Secondary | ICD-10-CM | POA: Diagnosis not present

## 2017-04-14 DIAGNOSIS — D649 Anemia, unspecified: Secondary | ICD-10-CM | POA: Diagnosis not present

## 2017-04-16 DIAGNOSIS — Z96652 Presence of left artificial knee joint: Secondary | ICD-10-CM | POA: Diagnosis not present

## 2017-04-16 DIAGNOSIS — Z471 Aftercare following joint replacement surgery: Secondary | ICD-10-CM | POA: Diagnosis not present

## 2017-04-16 DIAGNOSIS — Z79891 Long term (current) use of opiate analgesic: Secondary | ICD-10-CM | POA: Diagnosis not present

## 2017-04-16 DIAGNOSIS — D649 Anemia, unspecified: Secondary | ICD-10-CM | POA: Diagnosis not present

## 2017-04-19 DIAGNOSIS — Z471 Aftercare following joint replacement surgery: Secondary | ICD-10-CM | POA: Diagnosis not present

## 2017-04-19 DIAGNOSIS — Z96652 Presence of left artificial knee joint: Secondary | ICD-10-CM | POA: Diagnosis not present

## 2017-04-19 DIAGNOSIS — Z79891 Long term (current) use of opiate analgesic: Secondary | ICD-10-CM | POA: Diagnosis not present

## 2017-04-19 DIAGNOSIS — D649 Anemia, unspecified: Secondary | ICD-10-CM | POA: Diagnosis not present

## 2017-04-20 DIAGNOSIS — M1712 Unilateral primary osteoarthritis, left knee: Secondary | ICD-10-CM | POA: Diagnosis not present

## 2017-04-21 DIAGNOSIS — Z471 Aftercare following joint replacement surgery: Secondary | ICD-10-CM | POA: Diagnosis not present

## 2017-04-21 DIAGNOSIS — D649 Anemia, unspecified: Secondary | ICD-10-CM | POA: Diagnosis not present

## 2017-04-21 DIAGNOSIS — Z96652 Presence of left artificial knee joint: Secondary | ICD-10-CM | POA: Diagnosis not present

## 2017-04-21 DIAGNOSIS — Z79891 Long term (current) use of opiate analgesic: Secondary | ICD-10-CM | POA: Diagnosis not present

## 2017-04-23 DIAGNOSIS — D649 Anemia, unspecified: Secondary | ICD-10-CM | POA: Diagnosis not present

## 2017-04-23 DIAGNOSIS — Z96652 Presence of left artificial knee joint: Secondary | ICD-10-CM | POA: Diagnosis not present

## 2017-04-23 DIAGNOSIS — Z79891 Long term (current) use of opiate analgesic: Secondary | ICD-10-CM | POA: Diagnosis not present

## 2017-04-23 DIAGNOSIS — Z471 Aftercare following joint replacement surgery: Secondary | ICD-10-CM | POA: Diagnosis not present

## 2017-04-26 DIAGNOSIS — Z79891 Long term (current) use of opiate analgesic: Secondary | ICD-10-CM | POA: Diagnosis not present

## 2017-04-26 DIAGNOSIS — Z96652 Presence of left artificial knee joint: Secondary | ICD-10-CM | POA: Diagnosis not present

## 2017-04-26 DIAGNOSIS — D649 Anemia, unspecified: Secondary | ICD-10-CM | POA: Diagnosis not present

## 2017-04-26 DIAGNOSIS — Z471 Aftercare following joint replacement surgery: Secondary | ICD-10-CM | POA: Diagnosis not present

## 2017-04-27 DIAGNOSIS — D649 Anemia, unspecified: Secondary | ICD-10-CM | POA: Diagnosis not present

## 2017-04-27 DIAGNOSIS — Z79891 Long term (current) use of opiate analgesic: Secondary | ICD-10-CM | POA: Diagnosis not present

## 2017-04-27 DIAGNOSIS — Z96652 Presence of left artificial knee joint: Secondary | ICD-10-CM | POA: Diagnosis not present

## 2017-04-27 DIAGNOSIS — Z471 Aftercare following joint replacement surgery: Secondary | ICD-10-CM | POA: Diagnosis not present

## 2017-04-30 DIAGNOSIS — I10 Essential (primary) hypertension: Secondary | ICD-10-CM | POA: Diagnosis not present

## 2017-04-30 DIAGNOSIS — D509 Iron deficiency anemia, unspecified: Secondary | ICD-10-CM | POA: Diagnosis not present

## 2017-04-30 DIAGNOSIS — I82502 Chronic embolism and thrombosis of unspecified deep veins of left lower extremity: Secondary | ICD-10-CM | POA: Diagnosis not present

## 2017-05-03 DIAGNOSIS — M1712 Unilateral primary osteoarthritis, left knee: Secondary | ICD-10-CM | POA: Diagnosis not present

## 2017-05-03 DIAGNOSIS — R262 Difficulty in walking, not elsewhere classified: Secondary | ICD-10-CM | POA: Diagnosis not present

## 2017-05-03 DIAGNOSIS — M25662 Stiffness of left knee, not elsewhere classified: Secondary | ICD-10-CM | POA: Diagnosis not present

## 2017-05-03 DIAGNOSIS — M25562 Pain in left knee: Secondary | ICD-10-CM | POA: Diagnosis not present

## 2017-05-05 DIAGNOSIS — M25662 Stiffness of left knee, not elsewhere classified: Secondary | ICD-10-CM | POA: Diagnosis not present

## 2017-05-05 DIAGNOSIS — R531 Weakness: Secondary | ICD-10-CM | POA: Diagnosis not present

## 2017-05-05 DIAGNOSIS — R262 Difficulty in walking, not elsewhere classified: Secondary | ICD-10-CM | POA: Diagnosis not present

## 2017-05-05 DIAGNOSIS — M25562 Pain in left knee: Secondary | ICD-10-CM | POA: Diagnosis not present

## 2017-05-07 DIAGNOSIS — M25662 Stiffness of left knee, not elsewhere classified: Secondary | ICD-10-CM | POA: Diagnosis not present

## 2017-05-07 DIAGNOSIS — R262 Difficulty in walking, not elsewhere classified: Secondary | ICD-10-CM | POA: Diagnosis not present

## 2017-05-07 DIAGNOSIS — M1712 Unilateral primary osteoarthritis, left knee: Secondary | ICD-10-CM | POA: Diagnosis not present

## 2017-05-07 DIAGNOSIS — M25562 Pain in left knee: Secondary | ICD-10-CM | POA: Diagnosis not present

## 2017-05-10 DIAGNOSIS — M1712 Unilateral primary osteoarthritis, left knee: Secondary | ICD-10-CM | POA: Diagnosis not present

## 2017-05-10 DIAGNOSIS — R262 Difficulty in walking, not elsewhere classified: Secondary | ICD-10-CM | POA: Diagnosis not present

## 2017-05-10 DIAGNOSIS — M25662 Stiffness of left knee, not elsewhere classified: Secondary | ICD-10-CM | POA: Diagnosis not present

## 2017-05-10 DIAGNOSIS — M25562 Pain in left knee: Secondary | ICD-10-CM | POA: Diagnosis not present

## 2017-05-12 DIAGNOSIS — R262 Difficulty in walking, not elsewhere classified: Secondary | ICD-10-CM | POA: Diagnosis not present

## 2017-05-12 DIAGNOSIS — M25562 Pain in left knee: Secondary | ICD-10-CM | POA: Diagnosis not present

## 2017-05-12 DIAGNOSIS — M1712 Unilateral primary osteoarthritis, left knee: Secondary | ICD-10-CM | POA: Diagnosis not present

## 2017-05-12 DIAGNOSIS — M25662 Stiffness of left knee, not elsewhere classified: Secondary | ICD-10-CM | POA: Diagnosis not present

## 2017-05-14 DIAGNOSIS — R531 Weakness: Secondary | ICD-10-CM | POA: Diagnosis not present

## 2017-05-14 DIAGNOSIS — M25562 Pain in left knee: Secondary | ICD-10-CM | POA: Diagnosis not present

## 2017-05-14 DIAGNOSIS — M25662 Stiffness of left knee, not elsewhere classified: Secondary | ICD-10-CM | POA: Diagnosis not present

## 2017-05-14 DIAGNOSIS — M1712 Unilateral primary osteoarthritis, left knee: Secondary | ICD-10-CM | POA: Diagnosis not present

## 2017-05-14 DIAGNOSIS — R262 Difficulty in walking, not elsewhere classified: Secondary | ICD-10-CM | POA: Diagnosis not present

## 2017-05-17 DIAGNOSIS — M25662 Stiffness of left knee, not elsewhere classified: Secondary | ICD-10-CM | POA: Diagnosis not present

## 2017-05-17 DIAGNOSIS — R262 Difficulty in walking, not elsewhere classified: Secondary | ICD-10-CM | POA: Diagnosis not present

## 2017-05-17 DIAGNOSIS — R531 Weakness: Secondary | ICD-10-CM | POA: Diagnosis not present

## 2017-05-17 DIAGNOSIS — M1712 Unilateral primary osteoarthritis, left knee: Secondary | ICD-10-CM | POA: Diagnosis not present

## 2017-05-17 DIAGNOSIS — M25562 Pain in left knee: Secondary | ICD-10-CM | POA: Diagnosis not present

## 2017-05-19 DIAGNOSIS — M1712 Unilateral primary osteoarthritis, left knee: Secondary | ICD-10-CM | POA: Diagnosis not present

## 2017-05-19 DIAGNOSIS — R262 Difficulty in walking, not elsewhere classified: Secondary | ICD-10-CM | POA: Diagnosis not present

## 2017-05-19 DIAGNOSIS — M25662 Stiffness of left knee, not elsewhere classified: Secondary | ICD-10-CM | POA: Diagnosis not present

## 2017-05-19 DIAGNOSIS — M25562 Pain in left knee: Secondary | ICD-10-CM | POA: Diagnosis not present

## 2017-05-20 DIAGNOSIS — M1712 Unilateral primary osteoarthritis, left knee: Secondary | ICD-10-CM | POA: Diagnosis not present

## 2017-05-21 DIAGNOSIS — M25662 Stiffness of left knee, not elsewhere classified: Secondary | ICD-10-CM | POA: Diagnosis not present

## 2017-05-21 DIAGNOSIS — R262 Difficulty in walking, not elsewhere classified: Secondary | ICD-10-CM | POA: Diagnosis not present

## 2017-05-21 DIAGNOSIS — M25562 Pain in left knee: Secondary | ICD-10-CM | POA: Diagnosis not present

## 2017-05-21 DIAGNOSIS — M1712 Unilateral primary osteoarthritis, left knee: Secondary | ICD-10-CM | POA: Diagnosis not present

## 2017-05-25 DIAGNOSIS — M25662 Stiffness of left knee, not elsewhere classified: Secondary | ICD-10-CM | POA: Diagnosis not present

## 2017-05-25 DIAGNOSIS — R262 Difficulty in walking, not elsewhere classified: Secondary | ICD-10-CM | POA: Diagnosis not present

## 2017-05-25 DIAGNOSIS — M25562 Pain in left knee: Secondary | ICD-10-CM | POA: Diagnosis not present

## 2017-05-25 DIAGNOSIS — M1712 Unilateral primary osteoarthritis, left knee: Secondary | ICD-10-CM | POA: Diagnosis not present

## 2017-05-27 DIAGNOSIS — M25562 Pain in left knee: Secondary | ICD-10-CM | POA: Diagnosis not present

## 2017-05-27 DIAGNOSIS — R262 Difficulty in walking, not elsewhere classified: Secondary | ICD-10-CM | POA: Diagnosis not present

## 2017-05-27 DIAGNOSIS — M25662 Stiffness of left knee, not elsewhere classified: Secondary | ICD-10-CM | POA: Diagnosis not present

## 2017-05-27 DIAGNOSIS — M1712 Unilateral primary osteoarthritis, left knee: Secondary | ICD-10-CM | POA: Diagnosis not present

## 2017-05-31 DIAGNOSIS — M1712 Unilateral primary osteoarthritis, left knee: Secondary | ICD-10-CM | POA: Diagnosis not present

## 2017-06-02 DIAGNOSIS — M25562 Pain in left knee: Secondary | ICD-10-CM | POA: Diagnosis not present

## 2017-06-02 DIAGNOSIS — R531 Weakness: Secondary | ICD-10-CM | POA: Diagnosis not present

## 2017-06-02 DIAGNOSIS — M25662 Stiffness of left knee, not elsewhere classified: Secondary | ICD-10-CM | POA: Diagnosis not present

## 2017-06-02 DIAGNOSIS — R262 Difficulty in walking, not elsewhere classified: Secondary | ICD-10-CM | POA: Diagnosis not present

## 2017-06-04 DIAGNOSIS — M25562 Pain in left knee: Secondary | ICD-10-CM | POA: Diagnosis not present

## 2017-06-04 DIAGNOSIS — R531 Weakness: Secondary | ICD-10-CM | POA: Diagnosis not present

## 2017-06-04 DIAGNOSIS — M25662 Stiffness of left knee, not elsewhere classified: Secondary | ICD-10-CM | POA: Diagnosis not present

## 2017-06-04 DIAGNOSIS — R262 Difficulty in walking, not elsewhere classified: Secondary | ICD-10-CM | POA: Diagnosis not present

## 2017-06-04 NOTE — Addendum Note (Signed)
Addendum  created 06/04/17 1141 by Lyn Hollingshead, MD   Sign clinical note

## 2017-06-09 DIAGNOSIS — M25562 Pain in left knee: Secondary | ICD-10-CM | POA: Diagnosis not present

## 2017-06-09 DIAGNOSIS — R531 Weakness: Secondary | ICD-10-CM | POA: Diagnosis not present

## 2017-06-09 DIAGNOSIS — M25662 Stiffness of left knee, not elsewhere classified: Secondary | ICD-10-CM | POA: Diagnosis not present

## 2017-06-09 DIAGNOSIS — R262 Difficulty in walking, not elsewhere classified: Secondary | ICD-10-CM | POA: Diagnosis not present

## 2017-06-11 DIAGNOSIS — M25662 Stiffness of left knee, not elsewhere classified: Secondary | ICD-10-CM | POA: Diagnosis not present

## 2017-06-11 DIAGNOSIS — R262 Difficulty in walking, not elsewhere classified: Secondary | ICD-10-CM | POA: Diagnosis not present

## 2017-06-11 DIAGNOSIS — M25562 Pain in left knee: Secondary | ICD-10-CM | POA: Diagnosis not present

## 2017-06-11 DIAGNOSIS — M1712 Unilateral primary osteoarthritis, left knee: Secondary | ICD-10-CM | POA: Diagnosis not present

## 2017-06-15 DIAGNOSIS — M25662 Stiffness of left knee, not elsewhere classified: Secondary | ICD-10-CM | POA: Diagnosis not present

## 2017-06-15 DIAGNOSIS — R531 Weakness: Secondary | ICD-10-CM | POA: Diagnosis not present

## 2017-06-15 DIAGNOSIS — M25562 Pain in left knee: Secondary | ICD-10-CM | POA: Diagnosis not present

## 2017-06-15 DIAGNOSIS — R262 Difficulty in walking, not elsewhere classified: Secondary | ICD-10-CM | POA: Diagnosis not present

## 2017-06-17 DIAGNOSIS — M25662 Stiffness of left knee, not elsewhere classified: Secondary | ICD-10-CM | POA: Diagnosis not present

## 2017-06-17 DIAGNOSIS — R262 Difficulty in walking, not elsewhere classified: Secondary | ICD-10-CM | POA: Diagnosis not present

## 2017-06-17 DIAGNOSIS — M25562 Pain in left knee: Secondary | ICD-10-CM | POA: Diagnosis not present

## 2017-06-17 DIAGNOSIS — R531 Weakness: Secondary | ICD-10-CM | POA: Diagnosis not present

## 2017-07-30 DIAGNOSIS — D229 Melanocytic nevi, unspecified: Secondary | ICD-10-CM | POA: Diagnosis not present

## 2017-07-30 DIAGNOSIS — I82502 Chronic embolism and thrombosis of unspecified deep veins of left lower extremity: Secondary | ICD-10-CM | POA: Diagnosis not present

## 2017-07-30 DIAGNOSIS — Z0001 Encounter for general adult medical examination with abnormal findings: Secondary | ICD-10-CM | POA: Diagnosis not present

## 2017-07-30 DIAGNOSIS — D509 Iron deficiency anemia, unspecified: Secondary | ICD-10-CM | POA: Diagnosis not present

## 2017-07-30 DIAGNOSIS — Z79899 Other long term (current) drug therapy: Secondary | ICD-10-CM | POA: Diagnosis not present

## 2017-07-30 DIAGNOSIS — I10 Essential (primary) hypertension: Secondary | ICD-10-CM | POA: Diagnosis not present

## 2017-08-03 DIAGNOSIS — D239 Other benign neoplasm of skin, unspecified: Secondary | ICD-10-CM | POA: Diagnosis not present

## 2017-08-03 DIAGNOSIS — D1801 Hemangioma of skin and subcutaneous tissue: Secondary | ICD-10-CM | POA: Diagnosis not present

## 2017-08-03 DIAGNOSIS — L814 Other melanin hyperpigmentation: Secondary | ICD-10-CM | POA: Diagnosis not present

## 2017-08-03 DIAGNOSIS — L821 Other seborrheic keratosis: Secondary | ICD-10-CM | POA: Diagnosis not present

## 2017-08-18 ENCOUNTER — Other Ambulatory Visit: Payer: Self-pay | Admitting: Family Medicine

## 2017-08-18 DIAGNOSIS — I82502 Chronic embolism and thrombosis of unspecified deep veins of left lower extremity: Secondary | ICD-10-CM

## 2017-08-23 ENCOUNTER — Ambulatory Visit
Admission: RE | Admit: 2017-08-23 | Discharge: 2017-08-23 | Disposition: A | Discharge status: Home / Self Care | Payer: PPO | Source: Ambulatory Visit | Attending: Family Medicine | Admitting: Family Medicine

## 2017-08-23 DIAGNOSIS — M7989 Other specified soft tissue disorders: Secondary | ICD-10-CM | POA: Diagnosis not present

## 2017-08-23 DIAGNOSIS — I82502 Chronic embolism and thrombosis of unspecified deep veins of left lower extremity: Secondary | ICD-10-CM

## 2017-08-31 DIAGNOSIS — M25561 Pain in right knee: Secondary | ICD-10-CM | POA: Diagnosis not present

## 2017-09-07 ENCOUNTER — Ambulatory Visit: Payer: Self-pay | Admitting: Physician Assistant

## 2017-09-07 NOTE — H&P (Signed)
TOTAL KNEE ADMISSION H&P  Patient is being admitted for right total knee arthroplasty.  Subjective:  Chief Complaint:right knee pain.  HPI: Bobby Conrad, 66 y.o. male, has a history of pain and functional disability in the right knee due to arthritis and has failed non-surgical conservative treatments for greater than 12 weeks to includeNSAID's and/or analgesics, corticosteriod injections, use of assistive devices and activity modification.  Onset of symptoms was gradual, starting 6 years ago with gradually worsening course since that time. The patient noted no past surgery on the right knee(s).  Patient currently rates pain in the right knee(s) at 8 out of 10 with activity. Patient has night pain, worsening of pain with activity and weight bearing, pain that interferes with activities of daily living, pain with passive range of motion, crepitus and joint swelling.  Patient has evidence of subchondral cysts and joint space narrowing by imaging studies. There is no active infection.  Patient Active Problem List   Diagnosis Date Noted  . Primary localized osteoarthritis of left knee 04/02/2017  . 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 Anemia   . GERD (gastroesophageal reflux disease)   . Hypertension   . Torn meniscus     Past Surgical History:  Procedure Laterality Date  . APPENDECTOMY     1960s  . CARPAL TUNNEL RELEASE    . HERNIA REPAIR     1950's  . KNEE ARTHROSCOPY Left 1990s  . LAPAROSCOPIC NISSEN FUNDOPLICATION N/A 9/56/2130   Procedure: LAPAROSCOPIC NISSEN FUNDOPLICATION;  Surgeon: Greer Pickerel, MD;  Location: [facility];  Service: General;  Laterality: N/A;  . TOTAL KNEE ARTHROPLASTY Left 04/02/2017   Procedure: TOTAL KNEE ARTHROPLASTY;  Surgeon: Earlie Server, MD;  Location: [facility];  Service: Orthopedics;  Laterality: Left;     (Not in a hospital admission) Allergies  Allergen Reactions  . No Known Allergies     Social  History  Substance Use Topics  . Smoking status: Never Smoker  . Smokeless tobacco: Never Used  . Alcohol use Yes     Comment: once or twice a year    No family history on file.   Review of Systems  HENT: Positive for hearing loss.   Cardiovascular: Positive for leg swelling.  Musculoskeletal: Positive for joint pain.  All other systems reviewed and are negative.   Objective:  Physical Exam  Constitutional: He is oriented to person, place, and time. He appears well-developed and well-nourished. No distress.  HENT:  Head: Normocephalic and atraumatic.  Nose: Nose normal.  Eyes: Pupils are equal, round, and reactive to light. Conjunctivae and EOM are normal.  Neck: Normal range of motion. Neck supple.  Cardiovascular: Normal rate, regular rhythm, normal heart sounds and intact distal pulses.   Respiratory: Effort normal and breath sounds normal. No respiratory distress. He has no wheezes.  GI: Soft. Bowel sounds are normal. He exhibits no distension. There is no tenderness.  Musculoskeletal:       Right knee: He exhibits normal range of motion. Tenderness found.  Lymphadenopathy:    He has no cervical adenopathy.  Neurological: He is alert and oriented to person, place, and time. No cranial nerve deficit.  Skin: Skin is warm and dry. No rash noted. No erythema.  Psychiatric: He has a normal mood and affect. His behavior is normal.    Vital signs in last 24 hours: @VSRANGES @  Labs:   Estimated body mass index is 30.85 kg/m as calculated from the  following:   Height as of 03/22/17: 5\' 7"  (1.702 m).   Weight as of 04/02/17: 89.4 kg (197 lb).   Imaging Review Plain radiographs demonstrate moderate degenerative joint disease of the right knee(s). The overall alignment ismild varus. The bone quality appears to be good for age and reported activity level.  Assessment/Plan:  End stage arthritis, right knee   The patient history, physical examination, clinical judgment of  the provider and imaging studies are consistent with end stage degenerative joint disease of the right knee(s) and total knee arthroplasty is deemed medically necessary. The treatment options including medical management, injection therapy arthroscopy and arthroplasty were discussed at length. The risks and benefits of total knee arthroplasty were presented and reviewed. The risks due to aseptic loosening, infection, stiffness, patella tracking problems, thromboembolic complications and other imponderables were discussed. The patient acknowledged the explanation, agreed to proceed with the plan and consent was signed. Patient is being admitted for inpatient treatment for surgery, pain control, PT, OT, prophylactic antibiotics, VTE prophylaxis, progressive ambulation and ADL's and discharge planning. The patient is planning to be discharged home with home health services

## 2017-09-10 NOTE — Progress Notes (Addendum)
PCP: [facility]  Cardiologist: pt denies  EKG: 04/02/17 in EPIC  Stress test: pt denies ever  ECHO: pt denies ever  Cardiac Cath: pt denies ever  Chest x-ray: 02/05/17, will get one today as patient reports he thinks he may have fractured a rib cleaning up yesterday

## 2017-09-10 NOTE — Pre-Procedure Instructions (Signed)
Bobby Conrad  09/10/2017      [id number] - [address], [address] [place] [address] Phone: [phone] Fax: [phone]    Your procedure is scheduled on September 24, 2017.  Report to [facility] Admitting at 530 AM.  Call this number if you have problems the morning of surgery:  [phone]   Remember:  Do not eat food or drink liquids after midnight.  Take these medicines the morning of surgery with A SIP OF WATER hydrocodone-acetaminophen (norco).  Stop Xarelto as instructed by your surgeon.  7 days prior to surgery STOP taking any Aspirin, Aleve, Naproxen, Ibuprofen, Motrin, Advil, Goody's, BC's, all herbal medications, fish oil, and all vitamins   Do not wear jewelry, make-up or nail polish.  Do not wear lotions, powders, or perfumes, or deoderant.  Men may shave face and neck.  Do not bring valuables to the hospital.  [facility] is not responsible for any belongings or valuables.  Contacts, dentures or bridgework may not be worn into surgery.  Leave your suitcase in the car.  After surgery it may be brought to your room.  For patients admitted to the hospital, discharge time will be determined by your treatment team.  Patients discharged the day of surgery will not be allowed to drive home.   Special instructions:   [facility]- Preparing For Surgery  Before surgery, you can play an important role. Because skin is not sterile, your skin needs to be as free of germs as possible. You can reduce the number of germs on your skin by washing with CHG (chlorahexidine gluconate) Soap before surgery.  CHG is an antiseptic cleaner which kills germs and bonds with the skin to continue killing germs even after washing.  Please do not use if you have an allergy to CHG or antibacterial soaps. If your skin becomes reddened/irritated stop using the CHG.  Do not shave (including legs and underarms) for at least 48 hours prior to  first CHG shower. It is OK to shave your face.  Please follow these instructions carefully.   1. Shower the NIGHT BEFORE SURGERY and the MORNING OF SURGERY with CHG.   2. If you chose to wash your hair, wash your hair first as usual with your normal shampoo.  3. After you shampoo, rinse your hair and body thoroughly to remove the shampoo.  4. Use CHG as you would any other liquid soap. You can apply CHG directly to the skin and wash gently with a scrungie or a clean washcloth.   5. Apply the CHG Soap to your body ONLY FROM THE NECK DOWN.  Do not use on open wounds or open sores. Avoid contact with your eyes, ears, mouth and genitals (private parts). Wash genitals (private parts) with your normal soap.  6. Wash thoroughly, paying special attention to the area where your surgery will be performed.  7. Thoroughly rinse your body with warm water from the neck down.  8. DO NOT shower/wash with your normal soap after using and rinsing off the CHG Soap.  9. Pat yourself dry with a CLEAN TOWEL.   10. Wear CLEAN PAJAMAS   11. Place CLEAN SHEETS on your bed the night of your first shower and DO NOT SLEEP WITH PETS.    Day of Surgery: Do not apply any deodorants/lotions. Please wear clean clothes to the hospital/surgery center.     Please read over the following fact sheets that  you were given. Pain Booklet, Coughing and Deep Breathing, MRSA Information and Surgical Site Infection Prevention

## 2017-09-13 ENCOUNTER — Encounter (HOSPITAL_COMMUNITY): Payer: Self-pay

## 2017-09-13 ENCOUNTER — Encounter (HOSPITAL_COMMUNITY)
Admission: RE | Admit: 2017-09-13 | Discharge: 2017-09-13 | Disposition: A | Discharge status: Home / Self Care | Payer: PPO | Source: Ambulatory Visit | Attending: Anesthesiology | Admitting: Anesthesiology

## 2017-09-13 ENCOUNTER — Encounter (HOSPITAL_COMMUNITY)
Admission: RE | Admit: 2017-09-13 | Discharge: 2017-09-13 | Disposition: A | Discharge status: Home / Self Care | Payer: PPO | Source: Ambulatory Visit | Attending: Orthopedic Surgery | Admitting: Orthopedic Surgery

## 2017-09-13 DIAGNOSIS — Z01818 Encounter for other preprocedural examination: Secondary | ICD-10-CM | POA: Diagnosis not present

## 2017-09-13 DIAGNOSIS — Z01812 Encounter for preprocedural laboratory examination: Secondary | ICD-10-CM | POA: Diagnosis not present

## 2017-09-13 DIAGNOSIS — M1711 Unilateral primary osteoarthritis, right knee: Secondary | ICD-10-CM | POA: Insufficient documentation

## 2017-09-13 HISTORY — DX: Acute embolism and thrombosis of unspecified deep veins of unspecified lower extremity: I82.409

## 2017-09-13 LAB — URINALYSIS, ROUTINE W REFLEX MICROSCOPIC
BILIRUBIN URINE: NEGATIVE
GLUCOSE, UA: NEGATIVE mg/dL
HGB URINE DIPSTICK: NEGATIVE
KETONES UR: NEGATIVE mg/dL
Leukocytes, UA: NEGATIVE
Nitrite: NEGATIVE
PH: 6 (ref 5.0–8.0)
PROTEIN: NEGATIVE mg/dL
Specific Gravity, Urine: 1.018 (ref 1.005–1.030)

## 2017-09-13 LAB — CBC WITH DIFFERENTIAL/PLATELET
Basophils Absolute: 0 10*3/uL (ref 0.0–0.1)
Basophils Relative: 1 %
EOS ABS: 0.1 10*3/uL (ref 0.0–0.7)
EOS PCT: 1 %
HCT: 42 % (ref 39.0–52.0)
Hemoglobin: 13.8 g/dL (ref 13.0–17.0)
LYMPHS ABS: 1.2 10*3/uL (ref 0.7–4.0)
Lymphocytes Relative: 21 %
MCH: 29.4 pg (ref 26.0–34.0)
MCHC: 32.9 g/dL (ref 30.0–36.0)
MCV: 89.4 fL (ref 78.0–100.0)
Monocytes Absolute: 0.5 10*3/uL (ref 0.1–1.0)
Monocytes Relative: 10 %
Neutro Abs: 3.8 10*3/uL (ref 1.7–7.7)
Neutrophils Relative %: 67 %
PLATELETS: 248 10*3/uL (ref 150–400)
RBC: 4.7 MIL/uL (ref 4.22–5.81)
RDW: 12.8 % (ref 11.5–15.5)
WBC: 5.6 10*3/uL (ref 4.0–10.5)

## 2017-09-13 LAB — APTT: APTT: 33 s (ref 24–36)

## 2017-09-13 LAB — COMPREHENSIVE METABOLIC PANEL
ALT: 17 U/L (ref 17–63)
ANION GAP: 7 (ref 5–15)
AST: 19 U/L (ref 15–41)
Albumin: 3.9 g/dL (ref 3.5–5.0)
Alkaline Phosphatase: 98 U/L (ref 38–126)
BUN: 11 mg/dL (ref 6–20)
CHLORIDE: 103 mmol/L (ref 101–111)
CO2: 28 mmol/L (ref 22–32)
CREATININE: 0.77 mg/dL (ref 0.61–1.24)
Calcium: 8.8 mg/dL — ABNORMAL LOW (ref 8.9–10.3)
GFR calc non Af Amer: >60 mL/min (ref >60)
Glucose, Bld: 98 mg/dL (ref 65–99)
POTASSIUM: 3.7 mmol/L (ref 3.5–5.1)
SODIUM: 138 mmol/L (ref 135–145)
Total Bilirubin: 0.9 mg/dL (ref 0.3–1.2)
Total Protein: 6.9 g/dL (ref 6.5–8.1)

## 2017-09-13 LAB — SURGICAL PCR SCREEN
MRSA, PCR: NEGATIVE
STAPHYLOCOCCUS AUREUS: NEGATIVE

## 2017-09-13 LAB — TYPE AND SCREEN
ABO/RH(D): O POS
ANTIBODY SCREEN: NEGATIVE

## 2017-09-13 LAB — PROTIME-INR
INR: 1.03
Prothrombin Time: 13.4 seconds (ref 11.4–15.2)

## 2017-09-14 LAB — URINE CULTURE: CULTURE: NO GROWTH

## 2017-09-23 MED ORDER — TRANEXAMIC ACID 1000 MG/10ML IV SOLN
2000.0000 mg | INTRAVENOUS | Status: AC
  Administered 2017-09-24: 2000 mg via TOPICAL
  Filled 2017-09-23: qty 20

## 2017-09-23 MED ORDER — BUPIVACAINE LIPOSOME 1.3 % IJ SUSP
20.0000 mL | Freq: Once | INTRAMUSCULAR | Status: DC
  Filled 2017-09-23 (×2): qty 20

## 2017-09-24 ENCOUNTER — Encounter (HOSPITAL_COMMUNITY)
Admission: RE | Disposition: A | Discharge status: Home Health | Payer: Self-pay | Source: Ambulatory Visit | Attending: Orthopedic Surgery

## 2017-09-24 ENCOUNTER — Inpatient Hospital Stay (HOSPITAL_COMMUNITY): Payer: PPO | Admitting: Certified Registered Nurse Anesthetist

## 2017-09-24 ENCOUNTER — Inpatient Hospital Stay (HOSPITAL_COMMUNITY)
Admission: RE | Admit: 2017-09-24 | Discharge: 2017-09-25 | DRG: 470 | Disposition: A | Discharge status: Home Health | Payer: PPO | Source: Ambulatory Visit | Attending: Orthopedic Surgery | Admitting: Orthopedic Surgery

## 2017-09-24 ENCOUNTER — Encounter (HOSPITAL_COMMUNITY): Payer: Self-pay | Admitting: *Deleted

## 2017-09-24 DIAGNOSIS — M1711 Unilateral primary osteoarthritis, right knee: Secondary | ICD-10-CM | POA: Diagnosis not present

## 2017-09-24 DIAGNOSIS — M21161 Varus deformity, not elsewhere classified, right knee: Secondary | ICD-10-CM | POA: Diagnosis not present

## 2017-09-24 DIAGNOSIS — Z86718 Personal history of other venous thrombosis and embolism: Secondary | ICD-10-CM | POA: Diagnosis not present

## 2017-09-24 DIAGNOSIS — Z96652 Presence of left artificial knee joint: Secondary | ICD-10-CM | POA: Diagnosis present

## 2017-09-24 DIAGNOSIS — G8918 Other acute postprocedural pain: Secondary | ICD-10-CM | POA: Diagnosis not present

## 2017-09-24 DIAGNOSIS — I10 Essential (primary) hypertension: Secondary | ICD-10-CM | POA: Diagnosis present

## 2017-09-24 DIAGNOSIS — I82409 Acute embolism and thrombosis of unspecified deep veins of unspecified lower extremity: Secondary | ICD-10-CM | POA: Diagnosis not present

## 2017-09-24 DIAGNOSIS — H9190 Unspecified hearing loss, unspecified ear: Secondary | ICD-10-CM | POA: Diagnosis present

## 2017-09-24 DIAGNOSIS — M1611 Unilateral primary osteoarthritis, right hip: Secondary | ICD-10-CM | POA: Diagnosis present

## 2017-09-24 DIAGNOSIS — Z23 Encounter for immunization: Secondary | ICD-10-CM

## 2017-09-24 HISTORY — PX: TOTAL KNEE ARTHROPLASTY: SHX125

## 2017-09-24 SURGERY — ARTHROPLASTY, KNEE, TOTAL
Anesthesia: Monitor Anesthesia Care | Site: Knee | Laterality: Right

## 2017-09-24 MED ORDER — HYDROMORPHONE HCL 1 MG/ML IJ SOLN: 0.2500 mg | INTRAMUSCULAR | Status: DC | PRN

## 2017-09-24 MED ORDER — ROCURONIUM BROMIDE 10 MG/ML (PF) SYRINGE
PREFILLED_SYRINGE | INTRAVENOUS | Status: AC
  Filled 2017-09-24: qty 5

## 2017-09-24 MED ORDER — INFLUENZA VAC SPLIT HIGH-DOSE 0.5 ML IM SUSY
0.5000 mL | PREFILLED_SYRINGE | INTRAMUSCULAR | Status: AC
  Administered 2017-09-25: 0.5 mL via INTRAMUSCULAR
  Filled 2017-09-24: qty 0.5

## 2017-09-24 MED ORDER — SODIUM CHLORIDE 0.9 % IR SOLN
Status: DC | PRN
  Administered 2017-09-24: 3000 mL

## 2017-09-24 MED ORDER — CHLORHEXIDINE GLUCONATE 4 % EX LIQD: 60.0000 mL | Freq: Once | CUTANEOUS | Status: DC

## 2017-09-24 MED ORDER — SODIUM CHLORIDE 0.9 % IV SOLN: INTRAVENOUS | Status: DC

## 2017-09-24 MED ORDER — BUPIVACAINE-EPINEPHRINE 0.5% -1:200000 IJ SOLN
INTRAMUSCULAR | Status: DC | PRN
  Administered 2017-09-24: 50 mL

## 2017-09-24 MED ORDER — ONDANSETRON HCL 4 MG/2ML IJ SOLN: 4.0000 mg | Freq: Four times a day (QID) | INTRAMUSCULAR | Status: DC | PRN

## 2017-09-24 MED ORDER — METOCLOPRAMIDE HCL 5 MG/ML IJ SOLN: 5.0000 mg | Freq: Three times a day (TID) | INTRAMUSCULAR | Status: DC | PRN

## 2017-09-24 MED ORDER — ONDANSETRON HCL 4 MG/2ML IJ SOLN
INTRAMUSCULAR | Status: AC
  Filled 2017-09-24: qty 2

## 2017-09-24 MED ORDER — RIVAROXABAN 10 MG PO TABS
10.0000 mg | ORAL_TABLET | Freq: Every day | ORAL | Status: DC
  Administered 2017-09-25: 10 mg via ORAL
  Filled 2017-09-24: qty 1

## 2017-09-24 MED ORDER — LIDOCAINE 2% (20 MG/ML) 5 ML SYRINGE
INTRAMUSCULAR | Status: AC
  Filled 2017-09-24: qty 5

## 2017-09-24 MED ORDER — MENTHOL 3 MG MT LOZG: 1.0000 | LOZENGE | OROMUCOSAL | Status: DC | PRN

## 2017-09-24 MED ORDER — CEFAZOLIN SODIUM-DEXTROSE 2-4 GM/100ML-% IV SOLN
INTRAVENOUS | Status: AC
  Filled 2017-09-24: qty 100

## 2017-09-24 MED ORDER — SUCCINYLCHOLINE CHLORIDE 200 MG/10ML IV SOSY
PREFILLED_SYRINGE | INTRAVENOUS | Status: AC
  Filled 2017-09-24: qty 10

## 2017-09-24 MED ORDER — PROPOFOL 10 MG/ML IV BOLUS
INTRAVENOUS | Status: AC
  Filled 2017-09-24: qty 20

## 2017-09-24 MED ORDER — ACETAMINOPHEN 650 MG RE SUPP: 650.0000 mg | Freq: Four times a day (QID) | RECTAL | Status: DC | PRN

## 2017-09-24 MED ORDER — BUPIVACAINE-EPINEPHRINE (PF) 0.5% -1:200000 IJ SOLN
INTRAMUSCULAR | Status: AC
  Filled 2017-09-24: qty 60

## 2017-09-24 MED ORDER — BUPIVACAINE-EPINEPHRINE 0.25% -1:200000 IJ SOLN
INTRAMUSCULAR | Status: AC
  Filled 2017-09-24: qty 1

## 2017-09-24 MED ORDER — BUPIVACAINE IN DEXTROSE 0.75-8.25 % IT SOLN
INTRATHECAL | Status: DC | PRN
  Administered 2017-09-24: 1.8 mL via INTRATHECAL

## 2017-09-24 MED ORDER — HYDROMORPHONE HCL 1 MG/ML IJ SOLN
1.0000 mg | INTRAMUSCULAR | Status: DC | PRN
  Administered 2017-09-24 – 2017-09-25 (×4): 1 mg via INTRAVENOUS
  Filled 2017-09-24 (×4): qty 1

## 2017-09-24 MED ORDER — LACTATED RINGERS IV SOLN
INTRAVENOUS | Status: DC | PRN
  Administered 2017-09-24: 07:00:00 via INTRAVENOUS

## 2017-09-24 MED ORDER — FLEET ENEMA 7-19 GM/118ML RE ENEM: 1.0000 | ENEMA | Freq: Once | RECTAL | Status: DC | PRN

## 2017-09-24 MED ORDER — OXYCODONE HCL 5 MG PO TABS: 5.0000 mg | ORAL_TABLET | Freq: Once | ORAL | Status: DC | PRN

## 2017-09-24 MED ORDER — EPHEDRINE 5 MG/ML INJ
INTRAVENOUS | Status: AC
  Filled 2017-09-24: qty 10

## 2017-09-24 MED ORDER — MIDAZOLAM HCL 2 MG/2ML IJ SOLN
INTRAMUSCULAR | Status: AC
  Filled 2017-09-24: qty 2

## 2017-09-24 MED ORDER — DEXAMETHASONE SODIUM PHOSPHATE 10 MG/ML IJ SOLN
INTRAMUSCULAR | Status: AC
  Filled 2017-09-24: qty 1

## 2017-09-24 MED ORDER — MIDAZOLAM HCL 5 MG/5ML IJ SOLN
INTRAMUSCULAR | Status: DC | PRN
  Administered 2017-09-24: 2 mg via INTRAVENOUS

## 2017-09-24 MED ORDER — ALUM & MAG HYDROXIDE-SIMETH 200-200-20 MG/5ML PO SUSP: 30.0000 mL | ORAL | Status: DC | PRN

## 2017-09-24 MED ORDER — SODIUM CHLORIDE 0.9 % IV SOLN
INTRAVENOUS | Status: DC
  Administered 2017-09-24: 13:00:00 via INTRAVENOUS

## 2017-09-24 MED ORDER — PROPOFOL 500 MG/50ML IV EMUL
INTRAVENOUS | Status: DC | PRN
  Administered 2017-09-24: 50 ug/kg/min via INTRAVENOUS

## 2017-09-24 MED ORDER — BUPIVACAINE LIPOSOME 1.3 % IJ SUSP
INTRAMUSCULAR | Status: DC | PRN
  Administered 2017-09-24: 20 mL

## 2017-09-24 MED ORDER — PHENOL 1.4 % MT LIQD: 1.0000 | OROMUCOSAL | Status: DC | PRN

## 2017-09-24 MED ORDER — RIVAROXABAN 10 MG PO TABS: 10.0000 mg | ORAL_TABLET | Freq: Every day | ORAL | 0 refills | Status: AC

## 2017-09-24 MED ORDER — FENTANYL CITRATE (PF) 100 MCG/2ML IJ SOLN
INTRAMUSCULAR | Status: DC | PRN
  Administered 2017-09-24: 25 ug via INTRAVENOUS
  Administered 2017-09-24: 50 ug via INTRAVENOUS
  Administered 2017-09-24: 25 ug via INTRAVENOUS
  Administered 2017-09-24: 50 ug via INTRAVENOUS

## 2017-09-24 MED ORDER — ROPIVACAINE HCL 5 MG/ML IJ SOLN
INTRAMUSCULAR | Status: DC | PRN
  Administered 2017-09-24: 30 mL via PERINEURAL

## 2017-09-24 MED ORDER — PHENYLEPHRINE 40 MCG/ML (10ML) SYRINGE FOR IV PUSH (FOR BLOOD PRESSURE SUPPORT)
PREFILLED_SYRINGE | INTRAVENOUS | Status: AC
  Filled 2017-09-24: qty 10

## 2017-09-24 MED ORDER — FENTANYL CITRATE (PF) 250 MCG/5ML IJ SOLN
INTRAMUSCULAR | Status: AC
  Filled 2017-09-24: qty 5

## 2017-09-24 MED ORDER — SODIUM CHLORIDE 0.9% FLUSH
INTRAVENOUS | Status: DC | PRN
  Administered 2017-09-24: 50 mL

## 2017-09-24 MED ORDER — DOCUSATE SODIUM 100 MG PO CAPS
100.0000 mg | ORAL_CAPSULE | Freq: Two times a day (BID) | ORAL | Status: DC
  Administered 2017-09-24 – 2017-09-25 (×3): 100 mg via ORAL
  Filled 2017-09-24 (×3): qty 1

## 2017-09-24 MED ORDER — METOCLOPRAMIDE HCL 5 MG PO TABS: 5.0000 mg | ORAL_TABLET | Freq: Three times a day (TID) | ORAL | Status: DC | PRN

## 2017-09-24 MED ORDER — CEFAZOLIN SODIUM-DEXTROSE 1-4 GM/50ML-% IV SOLN
1.0000 g | Freq: Four times a day (QID) | INTRAVENOUS | Status: AC
  Administered 2017-09-24 (×2): 1 g via INTRAVENOUS
  Filled 2017-09-24 (×2): qty 50

## 2017-09-24 MED ORDER — ACETAMINOPHEN 325 MG PO TABS
650.0000 mg | ORAL_TABLET | Freq: Four times a day (QID) | ORAL | Status: DC | PRN
  Administered 2017-09-25: 650 mg via ORAL
  Filled 2017-09-24: qty 2

## 2017-09-24 MED ORDER — PROPOFOL 10 MG/ML IV BOLUS
INTRAVENOUS | Status: DC | PRN
  Administered 2017-09-24: 30 mg via INTRAVENOUS

## 2017-09-24 MED ORDER — ONDANSETRON HCL 4 MG PO TABS: 4.0000 mg | ORAL_TABLET | Freq: Four times a day (QID) | ORAL | Status: DC | PRN

## 2017-09-24 MED ORDER — PROMETHAZINE HCL 25 MG/ML IJ SOLN: 6.2500 mg | INTRAMUSCULAR | Status: DC | PRN

## 2017-09-24 MED ORDER — HYDROCHLOROTHIAZIDE 25 MG PO TABS
12.5000 mg | ORAL_TABLET | Freq: Every day | ORAL | Status: DC
  Administered 2017-09-24 – 2017-09-25 (×2): 12.5 mg via ORAL
  Filled 2017-09-24 (×2): qty 1

## 2017-09-24 MED ORDER — CEFAZOLIN SODIUM-DEXTROSE 2-4 GM/100ML-% IV SOLN
2.0000 g | INTRAVENOUS | Status: AC
  Administered 2017-09-24: 2 g via INTRAVENOUS

## 2017-09-24 MED ORDER — DIPHENHYDRAMINE HCL 12.5 MG/5ML PO ELIX: 12.5000 mg | ORAL_SOLUTION | ORAL | Status: DC | PRN

## 2017-09-24 MED ORDER — OXYCODONE HCL 5 MG PO TABS
5.0000 mg | ORAL_TABLET | ORAL | Status: DC | PRN
  Administered 2017-09-24 – 2017-09-25 (×5): 10 mg via ORAL
  Filled 2017-09-24 (×6): qty 2

## 2017-09-24 MED ORDER — SENNOSIDES-DOCUSATE SODIUM 8.6-50 MG PO TABS: 1.0000 | ORAL_TABLET | Freq: Every evening | ORAL | Status: DC | PRN

## 2017-09-24 MED ORDER — OXYCODONE-ACETAMINOPHEN 5-325 MG PO TABS: 1.0000 | ORAL_TABLET | ORAL | 0 refills | Status: AC | PRN

## 2017-09-24 MED ORDER — SORBITOL 70 % SOLN: 30.0000 mL | Freq: Every day | Status: DC | PRN

## 2017-09-24 MED ORDER — OXYCODONE HCL 5 MG/5ML PO SOLN: 5.0000 mg | Freq: Once | ORAL | Status: DC | PRN

## 2017-09-24 MED ORDER — SUGAMMADEX SODIUM 200 MG/2ML IV SOLN
INTRAVENOUS | Status: AC
  Filled 2017-09-24: qty 2

## 2017-09-24 SURGICAL SUPPLY — 68 items
BAG DECANTER FOR FLEXI CONT (MISCELLANEOUS) ×2 IMPLANT
BANDAGE ACE 4X5 VEL STRL LF (GAUZE/BANDAGES/DRESSINGS) ×3 IMPLANT
BANDAGE ACE 6X5 VEL STRL LF (GAUZE/BANDAGES/DRESSINGS) ×3 IMPLANT
BANDAGE ELASTIC 4 VELCRO ST LF (GAUZE/BANDAGES/DRESSINGS) ×2 IMPLANT
BANDAGE ELASTIC 6 VELCRO ST LF (GAUZE/BANDAGES/DRESSINGS) ×2 IMPLANT
BANDAGE ESMARK 6X9 LF (GAUZE/BANDAGES/DRESSINGS) ×1 IMPLANT
BLADE SAGITTAL 25.0X1.19X90 (BLADE) ×2 IMPLANT
BLADE SAGITTAL 25.0X1.19X90MM (BLADE) ×1
BLADE SAW SAG 90X13X1.27 (BLADE) ×3 IMPLANT
BNDG CMPR 9X6 STRL LF SNTH (GAUZE/BANDAGES/DRESSINGS) ×1
BNDG ESMARK 6X9 LF (GAUZE/BANDAGES/DRESSINGS) ×3
BOWL SMART MIX CTS (DISPOSABLE) ×3 IMPLANT
CAP KNEE TOTAL 3 SIGMA ×2 IMPLANT
CEMENT HV SMART SET (Cement) ×4 IMPLANT
COVER SURGICAL LIGHT HANDLE (MISCELLANEOUS) ×3 IMPLANT
CUFF TOURNIQUET SINGLE 34IN LL (TOURNIQUET CUFF) ×3 IMPLANT
CUFF TOURNIQUET SINGLE 44IN (TOURNIQUET CUFF) IMPLANT
DRAPE INCISE IOBAN 66X45 STRL (DRAPES) IMPLANT
DRAPE ORTHO SPLIT 77X108 STRL (DRAPES) ×6
DRAPE SURG ORHT 6 SPLT 77X108 (DRAPES) ×2 IMPLANT
DRAPE U-SHAPE 47X51 STRL (DRAPES) ×3 IMPLANT
DRSG ADAPTIC 3X8 NADH LF (GAUZE/BANDAGES/DRESSINGS) ×3 IMPLANT
DRSG PAD ABDOMINAL 8X10 ST (GAUZE/BANDAGES/DRESSINGS) ×6 IMPLANT
DURAPREP 26ML APPLICATOR (WOUND CARE) ×3 IMPLANT
ELECT REM PT RETURN 9FT ADLT (ELECTROSURGICAL) ×3
ELECTRODE REM PT RTRN 9FT ADLT (ELECTROSURGICAL) ×1 IMPLANT
EVACUATOR 1/8 PVC DRAIN (DRAIN) IMPLANT
FACESHIELD WRAPAROUND (MASK) ×3 IMPLANT
FACESHIELD WRAPAROUND OR TEAM (MASK) ×1 IMPLANT
GAUZE SPONGE 4X4 12PLY STRL (GAUZE/BANDAGES/DRESSINGS) ×3 IMPLANT
GAUZE SPONGE 4X4 12PLY STRL LF (GAUZE/BANDAGES/DRESSINGS) ×2 IMPLANT
GLOVE BIOGEL PI IND STRL 8 (GLOVE) ×4 IMPLANT
GLOVE BIOGEL PI INDICATOR 8 (GLOVE) ×4
GLOVE ORTHO TXT STRL SZ7.5 (GLOVE) ×5 IMPLANT
GLOVE SURG ORTHO 8.0 STRL STRW (GLOVE) ×5 IMPLANT
GOWN STRL REUS W/ TWL LRG LVL3 (GOWN DISPOSABLE) ×1 IMPLANT
GOWN STRL REUS W/ TWL XL LVL3 (GOWN DISPOSABLE) ×1 IMPLANT
GOWN STRL REUS W/TWL 2XL LVL3 (GOWN DISPOSABLE) ×3 IMPLANT
GOWN STRL REUS W/TWL LRG LVL3 (GOWN DISPOSABLE) ×3
GOWN STRL REUS W/TWL XL LVL3 (GOWN DISPOSABLE) ×3
HANDPIECE INTERPULSE COAX TIP (DISPOSABLE) ×3
HOOD PEEL AWAY FACE SHEILD DIS (HOOD) ×3 IMPLANT
IMMOBILIZER KNEE 22 UNIV (SOFTGOODS) ×2 IMPLANT
KIT BASIN OR (CUSTOM PROCEDURE TRAY) ×3 IMPLANT
KIT ROOM TURNOVER OR (KITS) ×3 IMPLANT
MANIFOLD NEPTUNE II (INSTRUMENTS) ×3 IMPLANT
NEEDLE 22X1 1/2 (OR ONLY) (NEEDLE) ×6 IMPLANT
NS IRRIG 1000ML POUR BTL (IV SOLUTION) ×3 IMPLANT
PACK TOTAL JOINT (CUSTOM PROCEDURE TRAY) ×3 IMPLANT
PAD ABD 8X10 STRL (GAUZE/BANDAGES/DRESSINGS) ×4 IMPLANT
PAD ARMBOARD 7.5X6 YLW CONV (MISCELLANEOUS) ×4 IMPLANT
PAD CAST 4YDX4 CTTN HI CHSV (CAST SUPPLIES) ×1 IMPLANT
PADDING CAST COTTON 4X4 STRL (CAST SUPPLIES) ×3
PADDING CAST COTTON 6X4 STRL (CAST SUPPLIES) ×3 IMPLANT
SET HNDPC FAN SPRY TIP SCT (DISPOSABLE) ×1 IMPLANT
STAPLER VISISTAT 35W (STAPLE) ×3 IMPLANT
SUCTION FRAZIER HANDLE 10FR (MISCELLANEOUS) ×2
SUCTION TUBE FRAZIER 10FR DISP (MISCELLANEOUS) ×1 IMPLANT
SUT ETHIBOND NAB CT1 #1 30IN (SUTURE) ×9 IMPLANT
SUT VIC AB 0 CT1 27 (SUTURE) ×3
SUT VIC AB 0 CT1 27XBRD ANBCTR (SUTURE) ×1 IMPLANT
SUT VIC AB 2-0 CT1 27 (SUTURE) ×6
SUT VIC AB 2-0 CT1 TAPERPNT 27 (SUTURE) ×2 IMPLANT
SYR CONTROL 10ML LL (SYRINGE) ×6 IMPLANT
TOWEL GREEN STERILE (TOWEL DISPOSABLE) ×3 IMPLANT
TRAY CATH 16FR W/PLASTIC CATH (SET/KITS/TRAYS/PACK) ×2 IMPLANT
TRAY FOLEY W/METER SILVER 16FR (SET/KITS/TRAYS/PACK) IMPLANT
WATER STERILE IRR 1000ML POUR (IV SOLUTION) ×3 IMPLANT

## 2017-09-24 NOTE — Progress Notes (Signed)
Orthopedic Tech Progress Note Patient Details:  Bobby Conrad [birth year] [id number]  CPM Right Knee CPM Right Knee: On Right Knee Flexion (Degrees): 90 Right Knee Extension (Degrees): 0 Additional Comments: foot roll   [name] 09/24/2017, 10:46 AM

## 2017-09-24 NOTE — Anesthesia Procedure Notes (Signed)
Anesthesia Regional Block: Adductor canal block   Pre-Anesthetic Checklist: ,, timeout performed, Correct Patient, Correct Site, Correct Laterality, Correct Procedure,, site marked, risks and benefits discussed, Surgical consent,  Pre-op evaluation,  At surgeon's request and post-op pain management  Laterality: Right  Prep: chloraprep       Needles:  Injection technique: Single-shot  Needle Type: Echogenic Stimulator Needle     Needle Length: 9cm  Needle Gauge: 21     Additional Needles:   Procedures:,,,, ultrasound used (permanent image in chart),,,,  Narrative:  Start time: 09/24/2017 7:05 AM End time: 09/24/2017 7:15 AM Injection made incrementally with aspirations every 5 mL.  Performed by: Personally  Anesthesiologist: Adele Barthel P  Additional Notes: Functioning IV was confirmed and monitors were applied.  A 87mm 21ga Arrow echogenic stimulator needle was used. Sterile prep, hand hygiene and sterile gloves were used.  Negative aspiration and negative test dose prior to incremental administration of local anesthetic. The patient tolerated the procedure well.

## 2017-09-24 NOTE — Brief Op Note (Signed)
09/24/2017  9:39 AM  PATIENT:  Bobby Conrad  66 y.o. male  PRE-OPERATIVE DIAGNOSIS:  djd right knee  POST-OPERATIVE DIAGNOSIS:  djd right knee  PROCEDURE:  Procedure(s): TOTAL KNEE ARTHROPLASTY (Right)  SURGEON:  Surgeon(s) and Role:    Earlie Server, MD - Primary  PHYSICIAN ASSISTANT: Chriss Czar, PA-C  ASSISTANTS:    ANESTHESIA:   local, regional, spinal and IV sedation  EBL:  No intake/output data recorded.  BLOOD ADMINISTERED:none  DRAINS: none   LOCAL MEDICATIONS USED:  MARCAINE     SPECIMEN:  No Specimen  DISPOSITION OF SPECIMEN:  N/A  COUNTS:  YES  TOURNIQUET:   Total Tourniquet Time Documented: Thigh (Right) - 62 minutes Total: Thigh (Right) - 62 minutes   DICTATION: .Other Dictation: Dictation Number unknown  PLAN OF CARE: Admit to inpatient   PATIENT DISPOSITION:  PACU - hemodynamically stable.   Delay start of Pharmacological VTE agent (>24hrs) due to surgical blood loss or risk of bleeding: yes

## 2017-09-24 NOTE — Anesthesia Preprocedure Evaluation (Addendum)
Anesthesia Evaluation  Patient identified by MRN, date of birth, ID band Patient awake    Reviewed: Allergy & Precautions, NPO status , Patient's Chart, lab work & pertinent test results  Airway Mallampati: I       Dental no notable dental hx.    Pulmonary neg pulmonary ROS,    Pulmonary exam normal        Cardiovascular hypertension, Pt. on medications Normal cardiovascular exam  ECG: SR, rate 71   Neuro/Psych negative neurological ROS  negative psych ROS   GI/Hepatic Neg liver ROS,   Endo/Other    Renal/GU negative Renal ROS     Musculoskeletal  (+) Arthritis , Osteoarthritis,    Abdominal (+) + obese,   Peds  Hematology   Anesthesia Other Findings DVT (deep venous thrombosis)  Reproductive/Obstetrics                            Anesthesia Physical  Anesthesia Plan  ASA: II  Anesthesia Plan: Regional and General   Post-op Pain Management: GA combined w/ Regional for post-op pain   Induction:   PONV Risk Score and Plan: 1 and Ondansetron, Dexamethasone and Midazolam  Airway Management Planned: LMA  Additional Equipment:   Intra-op Plan:   Post-operative Plan: Extubation in OR  Informed Consent: I have reviewed the patients History and Physical, chart, labs and discussed the procedure including the risks, benefits and alternatives for the proposed anesthesia with the patient or authorized representative who has indicated his/her understanding and acceptance.     Plan Discussed with: CRNA  Anesthesia Plan Comments:        Anesthesia Quick Evaluation

## 2017-09-24 NOTE — Discharge Instructions (Signed)
INSTRUCTIONS AFTER JOINT REPLACEMENT  ° °o Remove items at home which could result in a fall. This includes throw rugs or furniture in walking pathways °o ICE to the affected joint every three hours while awake for 30 minutes at a time, for at least the first 3-5 days, and then as needed for pain and swelling.  Continue to use ice for pain and swelling. You may notice swelling that will progress down to the foot and ankle.  This is normal after surgery.  Elevate your leg when you are not up walking on it.   °o Continue to use the breathing machine you got in the hospital (incentive spirometer) which will help keep your temperature down.  It is common for your temperature to cycle up and down following surgery, especially at night when you are not up moving around and exerting yourself.  The breathing machine keeps your lungs expanded and your temperature down. ° ° °DIET:  As you were doing prior to hospitalization, we recommend a well-balanced diet. ° °DRESSING / WOUND CARE / SHOWERING ° °You may change your dressing 3-5 days after surgery.  Then change the dressing every day with sterile gauze.  Please use good hand washing techniques before changing the dressing.  Do not use any lotions or creams on the incision until instructed by your surgeon. ° °ACTIVITY ° °o Increase activity slowly as tolerated, but follow the weight bearing instructions below.   °o No driving for 6 weeks or until further direction given by your physician.  You cannot drive while taking narcotics.  °o No lifting or carrying greater than 10 lbs. until further directed by your surgeon. °o Avoid periods of inactivity such as sitting longer than an hour when not asleep. This helps prevent blood clots.  °o You may return to work once you are authorized by your doctor.  ° ° ° °WEIGHT BEARING  ° °Weight bearing as tolerated with assist device (walker, cane, etc) as directed, use it as long as suggested by your surgeon or therapist, typically at  least 4-6 weeks. ° ° °EXERCISES ° °Results after joint replacement surgery are often greatly improved when you follow the exercise, range of motion and muscle strengthening exercises prescribed by your doctor. Safety measures are also important to protect the joint from further injury. Any time any of these exercises cause you to have increased pain or swelling, decrease what you are doing until you are comfortable again and then slowly increase them. If you have problems or questions, call your caregiver or physical therapist for advice.  ° °Rehabilitation is important following a joint replacement. After just a few days of immobilization, the muscles of the leg can become weakened and shrink (atrophy).  These exercises are designed to build up the tone and strength of the thigh and leg muscles and to improve motion. Often times heat used for twenty to thirty minutes before working out will loosen up your tissues and help with improving the range of motion but do not use heat for the first two weeks following surgery (sometimes heat can increase post-operative swelling).  ° °These exercises can be done on a training (exercise) mat, on the floor, on a table or on a bed. Use whatever works the best and is most comfortable for you.    Use music or television while you are exercising so that the exercises are a pleasant break in your day. This will make your life better with the exercises acting as a break   in your routine that you can look forward to.   Perform all exercises about fifteen times, three times per day or as directed.  You should exercise both the operative leg and the other leg as well. ° °Exercises include: °  °• Quad Sets - Tighten up the muscle on the front of the thigh (Quad) and hold for 5-10 seconds.   °• Straight Leg Raises - With your knee straight (if you were given a brace, keep it on), lift the leg to 60 degrees, hold for 3 seconds, and slowly lower the leg.  Perform this exercise against  resistance later as your leg gets stronger.  °• Leg Slides: Lying on your back, slowly slide your foot toward your buttocks, bending your knee up off the floor (only go as far as is comfortable). Then slowly slide your foot back down until your leg is flat on the floor again.  °• Angel Wings: Lying on your back spread your legs to the side as far apart as you can without causing discomfort.  °• Hamstring Strength:  Lying on your back, push your heel against the floor with your leg straight by tightening up the muscles of your buttocks.  Repeat, but this time bend your knee to a comfortable angle, and push your heel against the floor.  You may put a pillow under the heel to make it more comfortable if necessary.  ° °A rehabilitation program following joint replacement surgery can speed recovery and prevent re-injury in the future due to weakened muscles. Contact your doctor or a physical therapist for more information on knee rehabilitation.  ° ° °CONSTIPATION ° °Constipation is defined medically as fewer than three stools per week and severe constipation as less than one stool per week.  Even if you have a regular bowel pattern at home, your normal regimen is likely to be disrupted due to multiple reasons following surgery.  Combination of anesthesia, postoperative narcotics, change in appetite and fluid intake all can affect your bowels.  ° °YOU MUST use at least one of the following options; they are listed in order of increasing strength to get the job done.  They are all available over the counter, and you may need to use some, POSSIBLY even all of these options:   ° °Drink plenty of fluids (prune juice may be helpful) and high fiber foods °Colace 100 mg by mouth twice a day  °Senokot for constipation as directed and as needed Dulcolax (bisacodyl), take with full glass of water  °Miralax (polyethylene glycol) once or twice a day as needed. ° °If you have tried all these things and are unable to have a bowel  movement in the first 3-4 days after surgery call either your surgeon or your primary doctor.   ° °If you experience loose stools or diarrhea, hold the medications until you stool forms back up.  If your symptoms do not get better within 1 week or if they get worse, check with your doctor.  If you experience "the worst abdominal pain ever" or develop nausea or vomiting, please contact the office immediately for further recommendations for treatment. ° ° °ITCHING:  If you experience itching with your medications, try taking only a single pain pill, or even half a pain pill at a time.  You can also use Benadryl over the counter for itching or also to help with sleep.  ° °TED HOSE STOCKINGS:  Use stockings on both legs until for at least 2 weeks or as   directed by physician office. They may be removed at night for sleeping.  MEDICATIONS:  See your medication summary on the After Visit Summary that nursing will review with you.  You may have some home medications which will be placed on hold until you complete the course of blood thinner medication.  It is important for you to complete the blood thinner medication as prescribed.  PRECAUTIONS:  If you experience chest pain or shortness of breath - call 911 immediately for transfer to the hospital emergency department.   If you develop a fever greater that 101 F, purulent drainage from wound, increased redness or drainage from wound, foul odor from the wound/dressing, or calf pain - CONTACT YOUR SURGEON.                                                   FOLLOW-UP APPOINTMENTS:  If you do not already have a post-op appointment, please call the office for an appointment to be seen by your surgeon.  Guidelines for how soon to be seen are listed in your After Visit Summary, but are typically between 1-4 weeks after surgery.  OTHER INSTRUCTIONS:   Knee Replacement:  Do not place pillow under knee, focus on keeping the knee straight while resting. CPM  instructions: 0-90 degrees, 2 hours in the morning, 2 hours in the afternoon, and 2 hours in the evening. Place foam block, curve side up under heel at all times except when in CPM or when walking.  DO NOT modify, tear, cut, or change the foam block in any way.  MAKE SURE YOU:   Understand these instructions.   Get help right away if you are not doing well or get worse.    Thank you for letting us be a part of your medical care team.  It is a privilege we respect greatly.  We hope these instructions will help you stay on track for a fast and full recovery!   Information on my medicine - XARELTO (Rivaroxaban)  This medication education was reviewed with me or my healthcare representative as part of my discharge preparation.  The pharmacist that spoke with me during my hospital stay was:  Brain Hilts, [facility]  Why was Xarelto prescribed for you? Xarelto was prescribed for you to reduce the risk of blood clots forming after orthopedic surgery. The medical term for these abnormal blood clots is venous thromboembolism (VTE).  What do you need to know about xarelto ? Take your Xarelto ONCE DAILY at the same time every day. You may take it either with or without food.  If you have difficulty swallowing the tablet whole, you may crush it and mix in applesauce just prior to taking your dose.  Take Xarelto exactly as prescribed by your doctor and DO NOT stop taking Xarelto without talking to the doctor who prescribed the medication.  Stopping without other VTE prevention medication to take the place of Xarelto may increase your risk of developing a clot.  After discharge, you should have regular check-up appointments with your healthcare provider that is prescribing your Xarelto.    What do you do if you miss a dose? If you miss a dose, take it as soon as you remember on the same day then continue your regularly scheduled once daily regimen the next day. Do not take two doses of  Xarelto on the same day.   Important Safety Information A possible side effect of Xarelto is bleeding. You should call your healthcare provider right away if you experience any of the following: ? Bleeding from an injury or your nose that does not stop. ? Unusual colored urine (red or dark brown) or unusual colored stools (red or black). ? Unusual bruising for unknown reasons. ? A serious fall or if you hit your head (even if there is no bleeding).  Some medicines may interact with Xarelto and might increase your risk of bleeding while on Xarelto. To help avoid this, consult your healthcare provider or pharmacist prior to using any new prescription or non-prescription medications, including herbals, vitamins, non-steroidal anti-inflammatory drugs (NSAIDs) and supplements.  This website has more information on Xarelto: [email or web address]

## 2017-09-24 NOTE — Interval H&P Note (Signed)
History and Physical Interval Note:  09/24/2017 7:31 AM  Bobby Conrad  has presented today for surgery, with the diagnosis of djd right knee  The various methods of treatment have been discussed with the patient and family. After consideration of risks, benefits and other options for treatment, the patient has consented to  Procedure(s): TOTAL KNEE ARTHROPLASTY (Right) as a surgical intervention .  The patient's history has been reviewed, patient examined, no change in status, stable for surgery.  I have reviewed the patient's chart and labs.  Questions were answered to the patient's satisfaction.     Tymber Stallings JR,W D

## 2017-09-24 NOTE — Anesthesia Postprocedure Evaluation (Signed)
Anesthesia Post Note  Patient: Bobby Conrad  Procedure(s) Performed: Procedure(s) (LRB): TOTAL KNEE ARTHROPLASTY (Right)     Patient location during evaluation: PACU Anesthesia Type: Regional and Spinal Level of consciousness: oriented and awake and alert Pain management: pain level controlled Vital Signs Assessment: post-procedure vital signs reviewed and stable Respiratory status: spontaneous breathing, respiratory function stable and patient connected to nasal cannula oxygen Cardiovascular status: blood pressure returned to baseline and stable Postop Assessment: no headache, no backache and no apparent nausea or vomiting Anesthetic complications: no    Last Vitals:  Vitals:   09/24/17 1143 09/24/17 1200  BP: 138/82 (!) 169/96  Pulse: (!) 45 (!) 50  Resp: 10 12  Temp: 36.7 C (!) 36.4 C  SpO2: 100% 100%    Last Pain:  Vitals:   09/24/17 1200  TempSrc: Oral                 Ryan P Ellender

## 2017-09-24 NOTE — H&P (View-Only) (Signed)
TOTAL KNEE ADMISSION H&P  Patient is being admitted for right total knee arthroplasty.  Subjective:  Chief Complaint:right knee pain.  HPI: Bobby Conrad, 66 y.o. male, has a history of pain and functional disability in the right knee due to arthritis and has failed non-surgical conservative treatments for greater than 12 weeks to includeNSAID's and/or analgesics, corticosteriod injections, use of assistive devices and activity modification.  Onset of symptoms was gradual, starting 6 years ago with gradually worsening course since that time. The patient noted no past surgery on the right knee(s).  Patient currently rates pain in the right knee(s) at 8 out of 10 with activity. Patient has night pain, worsening of pain with activity and weight bearing, pain that interferes with activities of daily living, pain with passive range of motion, crepitus and joint swelling.  Patient has evidence of subchondral cysts and joint space narrowing by imaging studies. There is no active infection.  Patient Active Problem List   Diagnosis Date Noted  . Primary localized osteoarthritis of left knee 04/02/2017  . 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 Anemia   . GERD (gastroesophageal reflux disease)   . Hypertension   . Torn meniscus     Past Surgical History:  Procedure Laterality Date  . APPENDECTOMY     1960s  . CARPAL TUNNEL RELEASE    . HERNIA REPAIR     1950's  . KNEE ARTHROSCOPY Left 1990s  . LAPAROSCOPIC NISSEN FUNDOPLICATION N/A 6/38/7564   Procedure: LAPAROSCOPIC NISSEN FUNDOPLICATION;  Surgeon: Greer Pickerel, MD;  Location: [facility];  Service: General;  Laterality: N/A;  . TOTAL KNEE ARTHROPLASTY Left 04/02/2017   Procedure: TOTAL KNEE ARTHROPLASTY;  Surgeon: Earlie Server, MD;  Location: [facility];  Service: Orthopedics;  Laterality: Left;     (Not in a hospital admission) Allergies  Allergen Reactions  . No Known Allergies     Social  History  Substance Use Topics  . Smoking status: Never Smoker  . Smokeless tobacco: Never Used  . Alcohol use Yes     Comment: once or twice a year    No family history on file.   Review of Systems  HENT: Positive for hearing loss.   Cardiovascular: Positive for leg swelling.  Musculoskeletal: Positive for joint pain.  All other systems reviewed and are negative.   Objective:  Physical Exam  Constitutional: He is oriented to person, place, and time. He appears well-developed and well-nourished. No distress.  HENT:  Head: Normocephalic and atraumatic.  Nose: Nose normal.  Eyes: Pupils are equal, round, and reactive to light. Conjunctivae and EOM are normal.  Neck: Normal range of motion. Neck supple.  Cardiovascular: Normal rate, regular rhythm, normal heart sounds and intact distal pulses.   Respiratory: Effort normal and breath sounds normal. No respiratory distress. He has no wheezes.  GI: Soft. Bowel sounds are normal. He exhibits no distension. There is no tenderness.  Musculoskeletal:       Right knee: He exhibits normal range of motion. Tenderness found.  Lymphadenopathy:    He has no cervical adenopathy.  Neurological: He is alert and oriented to person, place, and time. No cranial nerve deficit.  Skin: Skin is warm and dry. No rash noted. No erythema.  Psychiatric: He has a normal mood and affect. His behavior is normal.    Vital signs in last 24 hours: @VSRANGES @  Labs:   Estimated body mass index is 30.85 kg/m as calculated from the  following:   Height as of 03/22/17: 5\' 7"  (1.702 m).   Weight as of 04/02/17: 89.4 kg (197 lb).   Imaging Review Plain radiographs demonstrate moderate degenerative joint disease of the right knee(s). The overall alignment ismild varus. The bone quality appears to be good for age and reported activity level.  Assessment/Plan:  End stage arthritis, right knee   The patient history, physical examination, clinical judgment of  the provider and imaging studies are consistent with end stage degenerative joint disease of the right knee(s) and total knee arthroplasty is deemed medically necessary. The treatment options including medical management, injection therapy arthroscopy and arthroplasty were discussed at length. The risks and benefits of total knee arthroplasty were presented and reviewed. The risks due to aseptic loosening, infection, stiffness, patella tracking problems, thromboembolic complications and other imponderables were discussed. The patient acknowledged the explanation, agreed to proceed with the plan and consent was signed. Patient is being admitted for inpatient treatment for surgery, pain control, PT, OT, prophylactic antibiotics, VTE prophylaxis, progressive ambulation and ADL's and discharge planning. The patient is planning to be discharged home with home health services

## 2017-09-24 NOTE — Evaluation (Signed)
Physical Therapy Evaluation Patient Details Name: Bobby Conrad MRN: [id number] DOB: [birth year] Today's Date: 09/24/2017   History of Present Illness  Pt is 66 y/o male s/p elective R TKA. PMH includes HTN and L TKA.   Clinical Impression  Pt is s/p surgery above with deficits below. PTA, pt was independent with functional mobility. Upon eval, pt with post op pain and weakness, and decreased balance. Required min to min guard assist for mobility. Reports he will be going to outpatient PT 2 weeks after d/c and will have assist from his wife as needed. Has all necessary DME at home. Will continue to follow acutely to maximize functional mobility independence and safety.     Follow Up Recommendations DC plan and follow up therapy as arranged by surgeon;Supervision for mobility/OOB    Equipment Recommendations  None recommended by PT    Recommendations for Other Services       Precautions / Restrictions Precautions Precautions: Knee Precaution Booklet Issued: Yes (comment) Precaution Comments: Reviewed supine knee exericses with pt.  Required Braces or Orthoses: Knee Immobilizer - Right Knee Immobilizer - Right: Other (comment) (until discontinued ) Restrictions Weight Bearing Restrictions: Yes RLE Weight Bearing: Weight bearing as tolerated      Mobility  Bed Mobility Overal bed mobility: Needs Assistance Bed Mobility: Supine to Sit     Supine to sit: Supervision     General bed mobility comments: Supervision for safety.   Transfers Overall transfer level: Needs assistance Equipment used: Rolling walker (2 wheeled) Transfers: Sit to/from Stand Sit to Stand: Min assist         General transfer comment: Min A for lift assist. Demonstrated safe hand placement.   Ambulation/Gait Ambulation/Gait assistance: Min guard Ambulation Distance (Feet): 25 Feet Assistive device: Rolling walker (2 wheeled) Gait Pattern/deviations: Step-to pattern;Decreased step length -  right;Decreased step length - left;Decreased weight shift to right;Antalgic Gait velocity: Decreased Gait velocity interpretation: Below normal speed for age/gender General Gait Details: Slow, slightly antalgic gait. Min guard for safety. Verbal cues for sequencing with RW.   Stairs            Wheelchair Mobility    Modified Rankin (Stroke Patients Only)       Balance Overall balance assessment: Needs assistance Sitting-balance support: No upper extremity supported;Feet supported Sitting balance-Leahy Scale: Good     Standing balance support: Bilateral upper extremity supported;During functional activity Standing balance-Leahy Scale: Poor Standing balance comment: Reliant on RW for stability                              Pertinent Vitals/Pain Pain Assessment: 0-10 Pain Score: 4  Pain Location: R knee  Pain Descriptors / Indicators: Aching;Operative site guarding Pain Intervention(s): Limited activity within patient's tolerance;Monitored during session;Repositioned    Home Living Family/patient expects to be discharged to:: Private residence Living Arrangements: Spouse/significant other Available Help at Discharge: Family;Available 24 hours/day Type of Home: House Home Access: Stairs to enter Entrance Stairs-Rails: Right Entrance Stairs-Number of Steps: 5 Home Layout: One level Home Equipment: Walker - 2 wheels;Bedside commode;Shower seat - built in;Grab bars - tub/shower;Other (comment) (CPM)      Prior Function Level of Independence: Independent               Hand Dominance   Dominant Hand: Right    Extremity/Trunk Assessment   Upper Extremity Assessment Upper Extremity Assessment: Defer to OT evaluation    Lower Extremity Assessment Lower  Extremity Assessment: RLE deficits/detail RLE Deficits / Details: Sensory in tact. Deficits consistent with post op pain and weakness. Able to perform ther ex below.     Cervical / Trunk  Assessment Cervical / Trunk Assessment: Normal  Communication   Communication: No difficulties  Cognition Arousal/Alertness: Awake/alert Behavior During Therapy: WFL for tasks assessed/performed Overall Cognitive Status: Within Functional Limits for tasks assessed                                        General Comments General comments (skin integrity, edema, etc.): Pt's wife present during session.     Exercises Total Joint Exercises Ankle Circles/Pumps: AROM;Both;20 reps Quad Sets: AROM;Right;10 reps Towel Squeeze: AROM;Both;10 reps Heel Slides: AROM;Right;10 reps Hip ABduction/ADduction: AROM;Right;10 reps Straight Leg Raises: AROM;Right;5 reps   Assessment/Plan    PT Assessment Patient needs continued PT services  PT Problem List Decreased strength;Decreased range of motion;Decreased balance;Decreased mobility;Pain;Decreased knowledge of use of DME       PT Treatment Interventions DME instruction;Gait training;Stair training;Functional mobility training;Therapeutic activities;Therapeutic exercise;Balance training;Neuromuscular re-education;Patient/family education    PT Goals (Current goals can be found in the Care Plan section)  Acute Rehab PT Goals Patient Stated Goal: to go home  PT Goal Formulation: With patient Time For Goal Achievement: 10/01/17 Potential to Achieve Goals: Good    Frequency 7X/week   Barriers to discharge        Co-evaluation               AM-PAC PT "6 Clicks" Daily Activity  Outcome Measure Difficulty turning over in bed (including adjusting bedclothes, sheets and blankets)?: None Difficulty moving from lying on back to sitting on the side of the bed? : None Difficulty sitting down on and standing up from a chair with arms (e.g., wheelchair, bedside commode, etc,.)?: Unable Help needed moving to and from a bed to chair (including a wheelchair)?: A Little Help needed walking in hospital room?: A Little Help needed  climbing 3-5 steps with a railing? : A Little 6 Click Score: 18    End of Session Equipment Utilized During Treatment: Gait belt;Right knee immobilizer Activity Tolerance: Patient tolerated treatment well Patient left: in chair;with call bell/phone within reach;with family/visitor present Nurse Communication: Mobility status PT Visit Diagnosis: Other abnormalities of gait and mobility (R26.89);Pain Pain - Right/Left: Right Pain - part of body: Knee    Time: 6945-0388 PT Time Calculation (min) (ACUTE ONLY): 30 min   Charges:   PT Evaluation $PT Eval Low Complexity: 1 Low PT Treatments $Gait Training: 8-22 mins   PT G Codes:        Leighton Ruff, PT, DPT  Acute Rehabilitation Services  Pager: [phone]   Rudean Hitt 09/24/2017, 4:26 PM

## 2017-09-24 NOTE — Anesthesia Procedure Notes (Signed)
Spinal  Patient location during procedure: OR Start time: 09/24/2017 7:40 AM End time: 09/24/2017 7:50 AM Staffing Anesthesiologist: Adele Barthel P Performed: anesthesiologist  Preanesthetic Checklist Completed: patient identified, surgical consent, pre-op evaluation, timeout performed, IV checked, risks and benefits discussed and monitors and equipment checked Spinal Block Patient position: sitting Prep: DuraPrep Patient monitoring: cardiac monitor, continuous pulse ox and blood pressure Approach: midline Location: L4-5 Injection technique: single-shot Needle Needle type: Pencan  Needle gauge: 24 G Needle length: 9 cm Assessment Sensory level: T10 Additional Notes Functioning IV was confirmed and monitors were applied. Sterile prep and drape, including hand hygiene and sterile gloves were used. The patient was positioned and the spine was prepped. The skin was anesthetized with lidocaine.  Free flow of clear CSF was obtained prior to injecting local anesthetic into the CSF.  The spinal needle aspirated freely following injection.  The needle was carefully withdrawn.  The patient tolerated the procedure well.

## 2017-09-24 NOTE — Transfer of Care (Signed)
Immediate Anesthesia Transfer of Care Note  Patient: Bobby Conrad  Procedure(s) Performed: Procedure(s): TOTAL KNEE ARTHROPLASTY (Right)  Patient Location: PACU  Anesthesia Type:MAC, Regional and Spinal  Level of Consciousness: awake, alert , oriented and patient cooperative  Airway & Oxygen Therapy: Patient Spontanous Breathing and Patient connected to nasal cannula oxygen  Post-op Assessment: Report given to RN and Post -op Vital signs reviewed and stable  Post vital signs: Reviewed and stable  Last Vitals:  Vitals:   09/24/17 0645  BP: (!) 174/92  Pulse: (!) 56  Resp: 20  Temp: 36.9 C  SpO2: 99%    Last Pain:  Vitals:   09/24/17 0645  TempSrc: Oral      Patients Stated Pain Goal: 3 (00/97/94 9971)  Complications: No apparent anesthesia complications

## 2017-09-25 DIAGNOSIS — M1711 Unilateral primary osteoarthritis, right knee: Secondary | ICD-10-CM | POA: Diagnosis not present

## 2017-09-25 DIAGNOSIS — M1611 Unilateral primary osteoarthritis, right hip: Secondary | ICD-10-CM | POA: Diagnosis present

## 2017-09-25 DIAGNOSIS — Z23 Encounter for immunization: Secondary | ICD-10-CM | POA: Diagnosis not present

## 2017-09-25 DIAGNOSIS — Z96651 Presence of right artificial knee joint: Secondary | ICD-10-CM | POA: Diagnosis not present

## 2017-09-25 LAB — BASIC METABOLIC PANEL
Anion gap: 9 (ref 5–15)
BUN: 13 mg/dL (ref 6–20)
CO2: 31 mmol/L (ref 22–32)
Calcium: 8.8 mg/dL — ABNORMAL LOW (ref 8.9–10.3)
Chloride: 96 mmol/L — ABNORMAL LOW (ref 101–111)
Creatinine, Ser: 0.85 mg/dL (ref 0.61–1.24)
GFR calc Af Amer: >60 mL/min (ref >60)
GLUCOSE: 136 mg/dL — AB (ref 65–99)
Potassium: 3.6 mmol/L (ref 3.5–5.1)
Sodium: 136 mmol/L (ref 135–145)

## 2017-09-25 LAB — CBC
HCT: 38.4 % — ABNORMAL LOW (ref 39.0–52.0)
Hemoglobin: 12.4 g/dL — ABNORMAL LOW (ref 13.0–17.0)
MCH: 28.8 pg (ref 26.0–34.0)
MCHC: 32.3 g/dL (ref 30.0–36.0)
MCV: 89.1 fL (ref 78.0–100.0)
PLATELETS: 281 10*3/uL (ref 150–400)
RBC: 4.31 MIL/uL (ref 4.22–5.81)
RDW: 12.4 % (ref 11.5–15.5)
WBC: 12.1 10*3/uL — AB (ref 4.0–10.5)

## 2017-09-25 NOTE — Progress Notes (Signed)
HH PT arranged through [facility].

## 2017-09-25 NOTE — Care Management CC44 (Signed)
Condition Code 44 Documentation Completed  Patient Details  Name: Bobby Conrad MRN: [id number] Date of Birth: 12-May-1951   Condition Code 44 given:  Yes Patient signature on Condition Code 44 notice:  Yes Documentation of 2 MD's agreement:  Yes Code 44 added to claim:  Yes    Carles Collet, RN 09/25/2017, 10:13 AM

## 2017-09-25 NOTE — Care Management Obs Status (Signed)
[place] NOTIFICATION   Patient Details  Name: Bobby Conrad MRN: [id number] Date of Birth: [birth year]   Medicare Observation Status Notification Given:  Yes    Carles Collet, RN 09/25/2017, 10:13 AM

## 2017-09-25 NOTE — Evaluation (Signed)
Occupational Therapy Evaluation Patient Details Name: Bobby Conrad MRN: [id number] DOB: [birth year] Today's Date: 09/25/2017    History of Present Illness Pt is 66 y/o male s/p elective R TKA. PMH includes HTN and L TKA.    Clinical Impression   PTA, pt was living with his wife and was independent. Currently, pt requires Min A for LB dressing and Min Guard A for functional mobility using RW. Provided education on LB ADLs, toilet transfer, and walk-in shower transfer with shower seat; pt demonstrated understanding. Answered all pt questions. Recommend dc home once medically stable per physician. All acute OT needs met and will sign off. Thank you.     Follow Up Recommendations  No OT follow up;Supervision/Assistance - 24 hour    Equipment Recommendations  None recommended by OT    Recommendations for Other Services       Precautions / Restrictions Precautions Precautions: Knee Precaution Comments: Reviewed supine knee exericses with pt.  Required Braces or Orthoses: Knee Immobilizer - Right Knee Immobilizer - Right: Other (comment) (until discontinued ) Restrictions Weight Bearing Restrictions: Yes RLE Weight Bearing: Weight bearing as tolerated      Mobility Bed Mobility               General bed mobility comments: In recliner upon arrival  Transfers Overall transfer level: Needs assistance Equipment used: Rolling walker (2 wheeled) Transfers: Sit to/from Stand Sit to Stand: Min guard         General transfer comment: Min Guard for safety    Balance Overall balance assessment: Needs assistance Sitting-balance support: No upper extremity supported;Feet supported Sitting balance-Leahy Scale: Good     Standing balance support: Bilateral upper extremity supported;During functional activity Standing balance-Leahy Scale: Fair Standing balance comment: Able to maintain static standing. Pt requiring Min A to correct single LOB in standing while donning  pants                           ADL either performed or assessed with clinical judgement   ADL Overall ADL's : Needs assistance/impaired Eating/Feeding: Set up;Sitting   Grooming: Min guard;Standing   Upper Body Bathing: Set up;Sitting   Lower Body Bathing: Min guard;Sit to/from stand   Upper Body Dressing : Set up;Sitting   Lower Body Dressing: Sit to/from stand;Minimal assistance Lower Body Dressing Details (indicate cue type and reason): Educated pt on compensatory tehcniques for LB dressing. Pt requiring Min A to don socks. Pt donned underwear and shorts with Min to manage R pant leg with non-slip socks. Pt also requiring Mi nA in standing to correct single LOB.  Toilet Transfer: Garment/textile technologist Details (indicate cue type and reason): MinGuard for safety     Tub/ Shower Transfer: Walk-in shower;Min guard;Ambulation;Shower seat;Cueing for sequencing;Rolling walker   Functional mobility during ADLs: Min guard;Rolling walker General ADL Comments: Pt demonstrating good functional performance post surgery. educated pt on LB ADls, toilet transfer, walk-in shower transfer, and knee precautions. Pt and wife verbalizing and demonstrating understanding.     Vision Baseline Vision/History: Wears glasses Wears Glasses: At all times Patient Visual Report: No change from baseline       Perception     Praxis      Pertinent Vitals/Pain Pain Assessment: Faces Faces Pain Scale: Hurts little more Pain Location: R knee  Pain Descriptors / Indicators: Aching;Operative site guarding Pain Intervention(s): Monitored during session;Repositioned     Hand Dominance Right   Extremity/Trunk Assessment  Upper Extremity Assessment Upper Extremity Assessment: Overall WFL for tasks assessed   Lower Extremity Assessment Lower Extremity Assessment: Defer to PT evaluation   Cervical / Trunk Assessment Cervical / Trunk Assessment: Normal    Communication Communication Communication: No difficulties   Cognition Arousal/Alertness: Awake/alert Behavior During Therapy: WFL for tasks assessed/performed Overall Cognitive Status: Within Functional Limits for tasks assessed                                     General Comments  Wife present throughout session    Exercises     Shoulder Instructions      Home Living Family/patient expects to be discharged to:: Private residence Living Arrangements: Spouse/significant other Available Help at Discharge: Family;Available 24 hours/day Type of Home: House Home Access: Stairs to enter [employer] of Steps: 5 Entrance Stairs-Rails: Right Home Layout: One level     Bathroom Shower/Tub: Occupational psychologist: Handicapped height     Home Equipment: Environmental consultant - 2 wheels;Bedside commode;Shower seat - built in;Grab bars - tub/shower;Other (comment);Hand held shower head;Shower seat (CPM)          Prior Functioning/Environment Level of Independence: Independent                 OT Problem List: Decreased range of motion;Decreased activity tolerance;Decreased strength;Impaired balance (sitting and/or standing);Decreased knowledge of use of DME or AE;Decreased knowledge of precautions;Pain      OT Treatment/Interventions:      OT Goals(Current goals can be found in the care plan section) Acute Rehab OT Goals Patient Stated Goal: to go home  OT Goal Formulation: With patient Time For Goal Achievement: 10/09/17 Potential to Achieve Goals: Good  OT Frequency:     Barriers to D/C:            Co-evaluation              AM-PAC PT "6 Clicks" Daily Activity     Outcome Measure Help from another person eating meals?: None Help from another person taking care of personal grooming?: A Little Help from another person toileting, which includes using toliet, bedpan, or urinal?: A Little Help from another person bathing (including  washing, rinsing, drying)?: A Little Help from another person to put on and taking off regular upper body clothing?: None Help from another person to put on and taking off regular lower body clothing?: A Little 6 Click Score: 20   End of Session Equipment Utilized During Treatment: Rolling walker Nurse Communication: Mobility status;Precautions;Weight bearing status (Pt in bathroom)  Activity Tolerance: Patient tolerated treatment well Patient left: with call bell/phone within reach;with family/visitor present (on toilet; RN notified)  OT Visit Diagnosis: Unsteadiness on feet (R26.81);Other abnormalities of gait and mobility (R26.89);Pain Pain - Right/Left: Right Pain - part of body: Knee                Time: 4944-9675 OT Time Calculation (min): 22 min Charges:  OT General Charges $OT Visit: 1 Visit OT Evaluation $OT Eval Low Complexity: 1 Low G-Codes: OT G-codes **NOT FOR INPATIENT CLASS** Functional Assessment Tool Used: Clinical judgement Functional Limitation: Self care Self Care Current Status (F1638): At least 20 percent but less than 40 percent impaired, limited or restricted Self Care Goal Status (G6659): At least 1 percent but less than 20 percent impaired, limited or restricted Self Care Discharge Status [phone]): At least 20 percent but less than  40 percent impaired, limited or restricted   Index, OTR/L Acute Rehab Pager: [phone] Office: [place] 09/25/2017, 10:21 AM

## 2017-09-25 NOTE — Discharge Summary (Signed)
PATIENT ID: Bobby Conrad        MRN:  [id number]          DOB/AGE: [birth year] / 66 y.o.    DISCHARGE SUMMARY  ADMISSION DATE:    09/24/2017 DISCHARGE DATE:   09/25/2017   ADMISSION DIAGNOSIS: djd right knee    DISCHARGE DIAGNOSIS:  djd right knee    ADDITIONAL DIAGNOSIS: Active Problems:   Primary localized osteoarthritis of right knee  Past Medical History:  Diagnosis Date  . Anemia   . DVT (deep venous thrombosis) ([place]) 03/2017   following left knee replacement  . GERD (gastroesophageal reflux disease)   . Hypertension   . Torn meniscus     PROCEDURE: Procedure(s): TOTAL KNEE ARTHROPLASTY Right on 09/24/2017  CONSULTS: PT/OT    HISTORY:  See H&P in chart  HOSPITAL COURSE:  Bobby Conrad is a 66 y.o. admitted on 09/24/2017 and found to have a diagnosis of djd right knee.  After appropriate laboratory studies were obtained  they were taken to the operating room on 09/24/2017 and underwent  Procedure(s): TOTAL KNEE ARTHROPLASTY  Right.   They were given perioperative antibiotics:  Anti-infectives    Start     Dose/Rate Route Frequency Ordered Stop   09/24/17 1400  ceFAZolin (ANCEF) IVPB 1 g/50 mL premix     1 g 100 mL/hr over 30 Minutes Intravenous Every 6 hours 09/24/17 1152 09/24/17 2101   09/24/17 0605  ceFAZolin (ANCEF) 2-4 GM/100ML-% IVPB    Comments:  [place], Jessica   : cabinet override      09/24/17 0605 09/24/17 0755   09/24/17 0600  ceFAZolin (ANCEF) IVPB 2g/100 mL premix     2 g 200 mL/hr over 30 Minutes Intravenous On call to O.R. 09/24/17 0600 09/24/17 0755    .  Tolerated the procedure well.   POD #1, allowed out of bed to a chair.  PT for ambulation and exercise program. IV saline locked.  O2 discontionued.   The remainder of the hospital course was dedicated to ambulation and strengthening.   The patient was discharged on 1 Day Post-Op in  Stable condition.  Blood products given:none  DIAGNOSTIC STUDIES: Recent vital signs:   Patient Vitals for the past 24 hrs:  BP Temp Temp src Pulse Resp SpO2  09/25/17 0444 (!) 144/83 99.2 F (37.3 C) Oral (!) 56 - 100 %  09/24/17 2340 136/82 98.2 F (36.8 C) Oral 65 - 99 %  09/24/17 2035 (!) 152/82 98.7 F (37.1 C) Oral 63 18 96 %  09/24/17 1200 (!) 169/96 (!) 97.5 F (36.4 C) Oral (!) 50 12 100 %  09/24/17 1143 138/82 98.1 F (36.7 C) - (!) 45 10 100 %  09/24/17 1045 140/84 (!) 97.5 F (36.4 C) - (!) 46 13 100 %  09/24/17 1030 138/75 - - (!) 47 10 100 %  09/24/17 1015 124/80 - - (!) 45 (!) 9 100 %  09/24/17 1000 - - - (!) 51 18 100 %  09/24/17 0954 125/83 97.6 F (36.4 C) - (!) 53 10 96 %       Recent laboratory studies:  Recent Labs  09/25/17 0605  WBC 12.1*  HGB 12.4*  HCT 38.4*  PLT 281    Recent Labs  09/25/17 0605  NA 136  K 3.6  CL 96*  CO2 31  BUN 13  CREATININE 0.85  GLUCOSE 136*  CALCIUM 8.8*   Lab Results  Component Value Date  INR 1.03 09/13/2017   INR 1.00 03/22/2017     Recent Radiographic Studies :  Dg Chest 2 View  Result Date: 09/13/2017 CLINICAL DATA:  Preop testing.  Knee surgery EXAM: CHEST  2 VIEW COMPARISON:  02/05/2017 FINDINGS: The heart size and mediastinal contours are within normal limits. Mild lingular scarring. Lungs otherwise clear. The visualized skeletal structures are unremarkable. IMPRESSION: No active cardiopulmonary disease. Electronically Signed   By: Franchot Gallo M.D.   On: 09/13/2017 11:22    DISCHARGE INSTRUCTIONS:   DISCHARGE MEDICATIONS:   Allergies as of 09/25/2017   No Known Allergies     Medication List    STOP taking these medications   apixaban 2.5 MG Tabs tablet Commonly known as:  ELIQUIS   aspirin EC 81 MG tablet   HYDROcodone-acetaminophen 5-325 MG tablet Commonly known as:  NORCO   traMADol 50 MG tablet Commonly known as:  ULTRAM     TAKE these medications   hydrochlorothiazide 12.5 MG tablet Commonly known as:  HYDRODIURIL Take 12.5 mg by mouth daily.    oxyCODONE-acetaminophen 5-325 MG tablet Commonly known as:  ROXICET Take 1-2 tablets by mouth every 4 (four) hours as needed.   rivaroxaban 10 MG Tabs tablet Commonly known as:  XARELTO Take 1 tablet (10 mg total) by mouth daily.            Discharge Care Instructions        Start     Ordered   09/24/17 0000  oxyCODONE-acetaminophen (ROXICET) 5-325 MG tablet  Every 4 hours PRN    Question:  Supervising Provider  Answer:  GEVIN, PEREA   09/24/17 0950   09/24/17 0000  rivaroxaban (XARELTO) 10 MG TABS tablet  Daily    Question:  Supervising Provider  Answer:  RYDEN, WAINER   09/24/17 0950      FOLLOW UP VISIT:   Follow-up Information    Earlie Server, MD. Schedule an appointment as soon as possible for a visit in 2 weeks.   Specialty:  Orthopedic Surgery Contact information: [place] [address] [phone]           DISPOSITION:   Home  CONDITION:  Stable   Chriss Czar, PA-C  09/25/2017 9:14 AM

## 2017-09-25 NOTE — Progress Notes (Signed)
Pt discharged to home in stable condition, all discharge instructions reviewed with pt and spouse, Rx's x 2 given to pt.  AKingRNBSN

## 2017-09-25 NOTE — Progress Notes (Signed)
Subjective: 1 Day Post-Op Procedure(s) (LRB): TOTAL KNEE ARTHROPLASTY (Right) Patient reports pain as mild.    Objective: Vital signs in last 24 hours: Temp:  [97.5 F (36.4 C)-99.2 F (37.3 C)] 99.2 F (37.3 C) (09/29 0444) Pulse Rate:  [45-65] 56 (09/29 0444) Resp:  [9-18] 18 (09/28 2035) BP: (124-169)/(75-96) 144/83 (09/29 0444) SpO2:  [96 %-100 %] 100 % (09/29 0444)  Intake/Output from previous day: 09/28 0701 - 09/29 0700 In: 800 [I.V.:800] Out: 1900 [Urine:1800; Blood:100] Intake/Output this shift: No intake/output data recorded.   Recent Labs  09/25/17 0605  HGB 12.4*    Recent Labs  09/25/17 0605  WBC 12.1*  RBC 4.31  HCT 38.4*  PLT 281    Recent Labs  09/25/17 0605  NA 136  K 3.6  CL 96*  CO2 31  BUN 13  CREATININE 0.85  GLUCOSE 136*  CALCIUM 8.8*   No results for input(s): LABPT, INR in the last 72 hours.  Neurovascular intact Sensation intact distally Dorsiflexion/Plantar flexion intact Incision: dressing C/D/I  Assessment/Plan: 1 Day Post-Op Procedure(s) (LRB): TOTAL KNEE ARTHROPLASTY (Right) Up with therapy  Discharge home  Bobby Conrad 09/25/2017, 9:11 AM

## 2017-09-25 NOTE — Care Management Note (Signed)
Case Management Note  Patient Details  Name: Bobby Conrad MRN: [id number] Date of Birth: 05/21/1951  Subjective/Objective:                 Patient to DC to home today, no DME needs per patient, F/U arranged through office, 30 day [place] card provided.    Action/Plan:  DC to home w wife Expected Discharge Date:  09/25/17               Expected Discharge Plan:  Home/Self Care  In-House Referral:     Discharge planning Services  CM Consult  Post Acute Care Choice:    Choice offered to:     DME Arranged:    DME Agency:     HH Arranged:    HH Agency:     Status of Service:  Completed, signed off  If discussed at [employer] of Stay Meetings, dates discussed:    Additional Comments:  Carles Collet, RN 09/25/2017, 9:48 AM

## 2017-09-25 NOTE — Progress Notes (Signed)
Physical Therapy Treatment Patient Details Name: Bobby Conrad MRN: [id number] DOB: 1951-08-02 Today's Date: 09/25/2017    History of Present Illness Pt is 66 y/o male s/p elective R TKA. PMH includes HTN and L TKA.     PT Comments    Pt demo'd great progress with mobility today with increased gait distance and stair education completed. Patient safe to D/C from a mobility standpoint based on progression towards goals set on PT eval.    Follow Up Recommendations  DC plan and follow up therapy as arranged by surgeon;Supervision for mobility/OOB     Equipment Recommendations  None recommended by PT       Precautions / Restrictions Precautions Precautions: Knee Precaution Comments: Reviewed supine knee exericses with pt.  Required Braces or Orthoses: Knee Immobilizer - Right Knee Immobilizer - Right: On when out of bed or walking Restrictions Weight Bearing Restrictions: Yes RLE Weight Bearing: Weight bearing as tolerated    Mobility  Bed Mobility Overal bed mobility: Modified Independent Bed Mobility: Supine to Sit;Sit to Supine     Supine to sit: Modified independent (Device/Increase time) Sit to supine: Modified independent (Device/Increase time)   General bed mobility comments: increased time with bed flat and no rails  Transfers Overall transfer level: Modified independent Equipment used: Rolling walker (2 wheeled) Transfers: Sit to/from Stand Sit to Stand: Modified independent (Device/Increase time)         General transfer comment: demo'd safe technique with session today  Ambulation/Gait Ambulation/Gait assistance: Supervision;Modified independent (Device/Increase time) Ambulation Distance (Feet): 400 Feet Assistive device: Rolling walker (2 wheeled) Gait Pattern/deviations: Step-through pattern;Step-to pattern;Antalgic;Decreased stance time - right Gait velocity: Decreased Gait velocity interpretation: Below normal speed for age/gender General Gait  Details: cues for correct positioning within walker, worked on progressing from step to gait pattern to a more reciprocal pattern within the range of motion the KI allowed.    Stairs Stairs: Yes   Stair Management: One rail Right;Step to pattern;Forwards;With walker Number of Stairs: 4 General stair comments: pt demo'd technique shown to him by HHPT for last knee replacement using rail/waker. safe with this technique with no issues noted.      Cognition Arousal/Alertness: Awake/alert Behavior During Therapy: WFL for tasks assessed/performed Overall Cognitive Status: Within Functional Limits for tasks assessed            Exercises Total Joint Exercises Quad Sets: AROM;Strengthening;Right;10 reps;Supine Heel Slides: AROM;AAROM;Strengthening;Right;10 reps;Supine Hip ABduction/ADduction: AROM;Strengthening;Right;10 reps;Supine Straight Leg Raises: AAROM;Strengthening;Right;Supine;10 reps Goniometric ROM: 5 - 30 degrees knee flexion on right in supine        Pertinent Vitals/Pain Pain Assessment: 0-10 Pain Score: 3  Faces Pain Scale: Hurts little more Pain Location: R knee  Pain Descriptors / Indicators: Aching;Tender;Operative site guarding Pain Intervention(s): Limited activity within patient's tolerance;Monitored during session;Premedicated before session    Home Living Family/patient expects to be discharged to:: Private residence Living Arrangements: Spouse/significant other Available Help at Discharge: Family;Available 24 hours/day Type of Home: House Home Access: Stairs to enter Entrance Stairs-Rails: Right Home Layout: One level Home Equipment: Walker - 2 wheels;Bedside commode;Shower seat - built in;Grab bars - tub/shower;Other (comment);Hand held shower head;Shower seat (CPM)      Prior Function Level of Independence: Independent          PT Goals (current goals can now be found in the care plan section) Acute Rehab PT Goals Patient Stated Goal: to go home   PT Goal Formulation: With patient Time For Goal Achievement: 10/01/17 Potential to Achieve Goals:  Good Progress towards PT goals: Progressing toward goals    Frequency    7X/week      PT Plan Current plan remains appropriate       AM-PAC PT "6 Clicks" Daily Activity  Outcome Measure  Difficulty turning over in bed (including adjusting bedclothes, sheets and blankets)?: None Difficulty moving from lying on back to sitting on the side of the bed? : None Difficulty sitting down on and standing up from a chair with arms (e.g., wheelchair, bedside commode, etc,.)?: None Help needed moving to and from a bed to chair (including a wheelchair)?: A Little Help needed walking in hospital room?: None Help needed climbing 3-5 steps with a railing? : None 6 Click Score: 23    End of Session Equipment Utilized During Treatment: Gait belt;Right knee immobilizer Activity Tolerance: Patient tolerated treatment well;No increased pain Patient left: in bed;with call bell/phone within reach;with nursing/sitter in room;with family/visitor present Nurse Communication: Mobility status PT Visit Diagnosis: Other abnormalities of gait and mobility (R26.89);Pain Pain - Right/Left: Right Pain - part of body: Knee     Time: 1125-1150 PT Time Calculation (min) (ACUTE ONLY): 25 min  Charges:  $Gait Training: 8-22 mins $Therapeutic Exercise: 8-22 mins                    G Codes:      Willow Ora, PTA, CLT Acute Rehab Services Office- [phone] 09/25/17, 12:02 PM  Willow Ora 09/25/2017, 12:01 PM

## 2017-09-26 ENCOUNTER — Encounter (HOSPITAL_COMMUNITY): Payer: Self-pay | Admitting: Orthopedic Surgery

## 2017-09-27 NOTE — Progress Notes (Signed)
   09/24/17 1546  PT G-Codes **NOT FOR INPATIENT CLASS**  Functional Assessment Tool Used AM-PAC 6 Clicks Basic Mobility;Clinical judgement  Functional Limitation Mobility: Walking and moving around  Mobility: Walking and Moving Around Current Status (T2458) CK  Mobility: Walking and Moving Around Goal Status (K9983) CI   Inserting G codes  Leighton Ruff, PT, DPT  Acute Rehabilitation Services  Pager: [phone]

## 2017-09-28 NOTE — Op Note (Signed)
NAME:  Bobby Conrad, NICASTRO NO.:  [id number]  MEDICAL RECORD NO.:  [id number]  LOCATION:                                 FACILITY:  PHYSICIAN:  Lockie Pares, M.D.         DATE OF BIRTH:  DATE OF PROCEDURE:  09/24/2017 DATE OF DISCHARGE:                              OPERATIVE REPORT   PREOPERATIVE DIAGNOSIS:  Severe osteoarthritis, right knee with varus deformity.  POSTOPERATIVE DIAGNOSIS:  Severe osteoarthritis, right knee with varus deformity.  OPERATION:  Right total knee replacement (Sigma size 3 femur __________ mm, size 4 tibial tray with 38 mm all poly patella).  SURGEON:  Lockie Pares, M.D.  ASSISTANTMarjo Bicker, PA.  ANESTHESIA:  Spinal anesthesia with local supplementation.  TOURNIQUET TIME:  60 minutes.  DESCRIPTION OF PROCEDURE:  Sterile prep and drape, supine position, exsanguination of leg, inflation of tourniquet to 350, straight skin incision with medial parapatellar approach to the knee made.  We did a medial release due to the varus deformity.  The femur was cut with an 11 mm 5-degree valgus cut.  Tibia was cut about 3 mm below the most diseased medial compartment.  Extension gap was measured at 10 mm. Femur was sized to be a size 3.  We placed all-in-1 cutting block in appropriate degree of external rotation, performed the anterior, posterior, and chamfer cuts.  PCL was released as well as stripping of the posterior capsule and osteophytes from the posterior medial aspect of the knee.  Tibia was sized to be a size 4, placement of the keel and the trial keel plate.  We then cut the box for the femur.  The patient had normal extension and resolution of his varus deformity.  Ligaments balanced nicely.  Patella was cut leaving about 15 mm ball of native patella for a 38 mm trial.  Again, all parameters deemed to be acceptable.  Cement was prepared on the back table, inserted in the doughy state after infiltration of the subcutaneous tissues  and capsular structures with a mixture of Exparel and Marcaine.  Final components were placed with the trial bearing.  Cement was allowed to harden.  The trial bearing was removed.  Excess of cement was removed from posterior aspect of the knee.  Tourniquet was released. No excessive bleeding was noted from the posterior aspect of the knee. Small bleeders were coagulated.  Closure was affected with #1 Ethibond, 2-0 Vicryl, and skin clips.  Taken to recovery room in stable condition.     Lockie Pares, M.D.   ______________________________ Lockie Pares, M.D.    WDC/MEDQ  D:  09/24/2017  T:  09/24/2017  Job:  [id number]

## 2017-10-07 ENCOUNTER — Other Ambulatory Visit (HOSPITAL_COMMUNITY): Payer: Self-pay | Admitting: Orthopedic Surgery

## 2017-10-07 ENCOUNTER — Ambulatory Visit (HOSPITAL_COMMUNITY)
Admission: RE | Admit: 2017-10-07 | Discharge: 2017-10-07 | Disposition: A | Discharge status: Home / Self Care | Payer: PPO | Source: Ambulatory Visit | Attending: Family Medicine | Admitting: Family Medicine

## 2017-10-07 DIAGNOSIS — M7989 Other specified soft tissue disorders: Secondary | ICD-10-CM | POA: Insufficient documentation

## 2017-10-07 DIAGNOSIS — M1711 Unilateral primary osteoarthritis, right knee: Secondary | ICD-10-CM | POA: Diagnosis not present

## 2017-10-07 DIAGNOSIS — M79604 Pain in right leg: Secondary | ICD-10-CM | POA: Diagnosis not present

## 2017-10-07 NOTE — Progress Notes (Signed)
**  Preliminary report by tech**  Right lower extremity venous duplex complete. There is no evidence of deep or superficial vein thrombosis involving the right lower extremity. All visualized vessels appear patent and compressible. Incidental findings are consistent with a ruptured Baker's Cyst measuring 2 cm high by 2.4 cm wide by greater than 4.7 cm long on the right. Results were given to Matthew Saras PA at Dr. Arlana Pouch office.  10/07/17 3:45 PM Carlos Levering RVT

## 2017-10-14 DIAGNOSIS — R262 Difficulty in walking, not elsewhere classified: Secondary | ICD-10-CM | POA: Diagnosis not present

## 2017-10-14 DIAGNOSIS — M1711 Unilateral primary osteoarthritis, right knee: Secondary | ICD-10-CM | POA: Diagnosis not present

## 2017-10-14 DIAGNOSIS — R531 Weakness: Secondary | ICD-10-CM | POA: Diagnosis not present

## 2017-10-14 DIAGNOSIS — Z471 Aftercare following joint replacement surgery: Secondary | ICD-10-CM | POA: Diagnosis not present

## 2017-10-20 DIAGNOSIS — R262 Difficulty in walking, not elsewhere classified: Secondary | ICD-10-CM | POA: Diagnosis not present

## 2017-10-20 DIAGNOSIS — M1711 Unilateral primary osteoarthritis, right knee: Secondary | ICD-10-CM | POA: Diagnosis not present

## 2017-10-20 DIAGNOSIS — Z471 Aftercare following joint replacement surgery: Secondary | ICD-10-CM | POA: Diagnosis not present

## 2017-10-20 DIAGNOSIS — R531 Weakness: Secondary | ICD-10-CM | POA: Diagnosis not present

## 2017-10-22 DIAGNOSIS — Z471 Aftercare following joint replacement surgery: Secondary | ICD-10-CM | POA: Diagnosis not present

## 2017-10-22 DIAGNOSIS — R262 Difficulty in walking, not elsewhere classified: Secondary | ICD-10-CM | POA: Diagnosis not present

## 2017-10-22 DIAGNOSIS — M1711 Unilateral primary osteoarthritis, right knee: Secondary | ICD-10-CM | POA: Diagnosis not present

## 2017-10-22 DIAGNOSIS — R531 Weakness: Secondary | ICD-10-CM | POA: Diagnosis not present

## 2017-10-25 DIAGNOSIS — M1711 Unilateral primary osteoarthritis, right knee: Secondary | ICD-10-CM | POA: Diagnosis not present

## 2017-10-25 DIAGNOSIS — R531 Weakness: Secondary | ICD-10-CM | POA: Diagnosis not present

## 2017-10-25 DIAGNOSIS — R262 Difficulty in walking, not elsewhere classified: Secondary | ICD-10-CM | POA: Diagnosis not present

## 2017-10-25 DIAGNOSIS — Z471 Aftercare following joint replacement surgery: Secondary | ICD-10-CM | POA: Diagnosis not present

## 2017-10-27 DIAGNOSIS — R531 Weakness: Secondary | ICD-10-CM | POA: Diagnosis not present

## 2017-10-27 DIAGNOSIS — M1711 Unilateral primary osteoarthritis, right knee: Secondary | ICD-10-CM | POA: Diagnosis not present

## 2017-10-27 DIAGNOSIS — Z471 Aftercare following joint replacement surgery: Secondary | ICD-10-CM | POA: Diagnosis not present

## 2017-10-27 DIAGNOSIS — R262 Difficulty in walking, not elsewhere classified: Secondary | ICD-10-CM | POA: Diagnosis not present

## 2017-10-29 DIAGNOSIS — M1711 Unilateral primary osteoarthritis, right knee: Secondary | ICD-10-CM | POA: Diagnosis not present

## 2017-10-29 DIAGNOSIS — R531 Weakness: Secondary | ICD-10-CM | POA: Diagnosis not present

## 2017-10-29 DIAGNOSIS — Z471 Aftercare following joint replacement surgery: Secondary | ICD-10-CM | POA: Diagnosis not present

## 2017-10-29 DIAGNOSIS — R262 Difficulty in walking, not elsewhere classified: Secondary | ICD-10-CM | POA: Diagnosis not present

## 2017-11-02 DIAGNOSIS — R262 Difficulty in walking, not elsewhere classified: Secondary | ICD-10-CM | POA: Diagnosis not present

## 2017-11-02 DIAGNOSIS — M1711 Unilateral primary osteoarthritis, right knee: Secondary | ICD-10-CM | POA: Diagnosis not present

## 2017-11-02 DIAGNOSIS — Z471 Aftercare following joint replacement surgery: Secondary | ICD-10-CM | POA: Diagnosis not present

## 2017-11-02 DIAGNOSIS — R531 Weakness: Secondary | ICD-10-CM | POA: Diagnosis not present

## 2017-11-04 DIAGNOSIS — M1711 Unilateral primary osteoarthritis, right knee: Secondary | ICD-10-CM | POA: Diagnosis not present

## 2017-11-05 DIAGNOSIS — R262 Difficulty in walking, not elsewhere classified: Secondary | ICD-10-CM | POA: Diagnosis not present

## 2017-11-05 DIAGNOSIS — R531 Weakness: Secondary | ICD-10-CM | POA: Diagnosis not present

## 2017-11-05 DIAGNOSIS — Z471 Aftercare following joint replacement surgery: Secondary | ICD-10-CM | POA: Diagnosis not present

## 2017-11-05 DIAGNOSIS — M1711 Unilateral primary osteoarthritis, right knee: Secondary | ICD-10-CM | POA: Diagnosis not present

## 2017-11-09 DIAGNOSIS — M1711 Unilateral primary osteoarthritis, right knee: Secondary | ICD-10-CM | POA: Diagnosis not present

## 2017-11-09 DIAGNOSIS — Z471 Aftercare following joint replacement surgery: Secondary | ICD-10-CM | POA: Diagnosis not present

## 2017-11-09 DIAGNOSIS — R531 Weakness: Secondary | ICD-10-CM | POA: Diagnosis not present

## 2017-11-09 DIAGNOSIS — R262 Difficulty in walking, not elsewhere classified: Secondary | ICD-10-CM | POA: Diagnosis not present

## 2017-11-12 DIAGNOSIS — M1711 Unilateral primary osteoarthritis, right knee: Secondary | ICD-10-CM | POA: Diagnosis not present

## 2017-11-12 DIAGNOSIS — R262 Difficulty in walking, not elsewhere classified: Secondary | ICD-10-CM | POA: Diagnosis not present

## 2017-11-12 DIAGNOSIS — R531 Weakness: Secondary | ICD-10-CM | POA: Diagnosis not present

## 2017-11-16 DIAGNOSIS — R531 Weakness: Secondary | ICD-10-CM | POA: Diagnosis not present

## 2017-11-16 DIAGNOSIS — Z471 Aftercare following joint replacement surgery: Secondary | ICD-10-CM | POA: Diagnosis not present

## 2017-11-16 DIAGNOSIS — R262 Difficulty in walking, not elsewhere classified: Secondary | ICD-10-CM | POA: Diagnosis not present

## 2017-11-16 DIAGNOSIS — M1711 Unilateral primary osteoarthritis, right knee: Secondary | ICD-10-CM | POA: Diagnosis not present

## 2017-12-16 DIAGNOSIS — M1711 Unilateral primary osteoarthritis, right knee: Secondary | ICD-10-CM | POA: Diagnosis not present

## 2018-01-07 DIAGNOSIS — K645 Perianal venous thrombosis: Secondary | ICD-10-CM | POA: Diagnosis not present

## 2018-01-07 DIAGNOSIS — K4090 Unilateral inguinal hernia, without obstruction or gangrene, not specified as recurrent: Secondary | ICD-10-CM | POA: Diagnosis not present

## 2018-01-28 DIAGNOSIS — K4090 Unilateral inguinal hernia, without obstruction or gangrene, not specified as recurrent: Secondary | ICD-10-CM | POA: Diagnosis not present

## 2018-01-28 DIAGNOSIS — Z86718 Personal history of other venous thrombosis and embolism: Secondary | ICD-10-CM | POA: Diagnosis not present

## 2018-02-02 DIAGNOSIS — K4090 Unilateral inguinal hernia, without obstruction or gangrene, not specified as recurrent: Secondary | ICD-10-CM | POA: Diagnosis not present

## 2018-02-07 DIAGNOSIS — K4090 Unilateral inguinal hernia, without obstruction or gangrene, not specified as recurrent: Secondary | ICD-10-CM | POA: Diagnosis not present

## 2018-03-22 DIAGNOSIS — M170 Bilateral primary osteoarthritis of knee: Secondary | ICD-10-CM | POA: Diagnosis not present

## 2018-08-03 DIAGNOSIS — R030 Elevated blood-pressure reading, without diagnosis of hypertension: Secondary | ICD-10-CM | POA: Diagnosis not present

## 2018-08-03 DIAGNOSIS — E7800 Pure hypercholesterolemia, unspecified: Secondary | ICD-10-CM | POA: Diagnosis not present

## 2018-08-03 DIAGNOSIS — D509 Iron deficiency anemia, unspecified: Secondary | ICD-10-CM | POA: Diagnosis not present

## 2018-08-03 DIAGNOSIS — M67449 Ganglion, unspecified hand: Secondary | ICD-10-CM | POA: Diagnosis not present

## 2018-08-03 DIAGNOSIS — Z0000 Encounter for general adult medical examination without abnormal findings: Secondary | ICD-10-CM | POA: Diagnosis not present

## 2018-08-03 DIAGNOSIS — Z136 Encounter for screening for cardiovascular disorders: Secondary | ICD-10-CM | POA: Diagnosis not present

## 2018-08-03 DIAGNOSIS — Z125 Encounter for screening for malignant neoplasm of prostate: Secondary | ICD-10-CM | POA: Diagnosis not present

## 2018-08-03 DIAGNOSIS — Z79899 Other long term (current) drug therapy: Secondary | ICD-10-CM | POA: Diagnosis not present

## 2018-08-03 IMAGING — RF DG UGI W/ GASTROGRAFIN
7 of 9 series · 14 of 20 positions shown · IV contrast (iopamidol)
Comparison: CT 11/27/2016

CLINICAL DATA: Post Nissen fundoplication

EXAM:
WATER SOLUBLE UPPER GI SERIES
TECHNIQUE: Single-column upper GI series was performed using water soluble
contrast.
CONTRAST:  150mL KUSHO0-OLL IOPAMIDOL (KUSHO0-OLL) INJECTION 61%

[Series 1: t abdomen supine · 0.15mm/px · 1 of 1 slices shown]
[im 1/1]
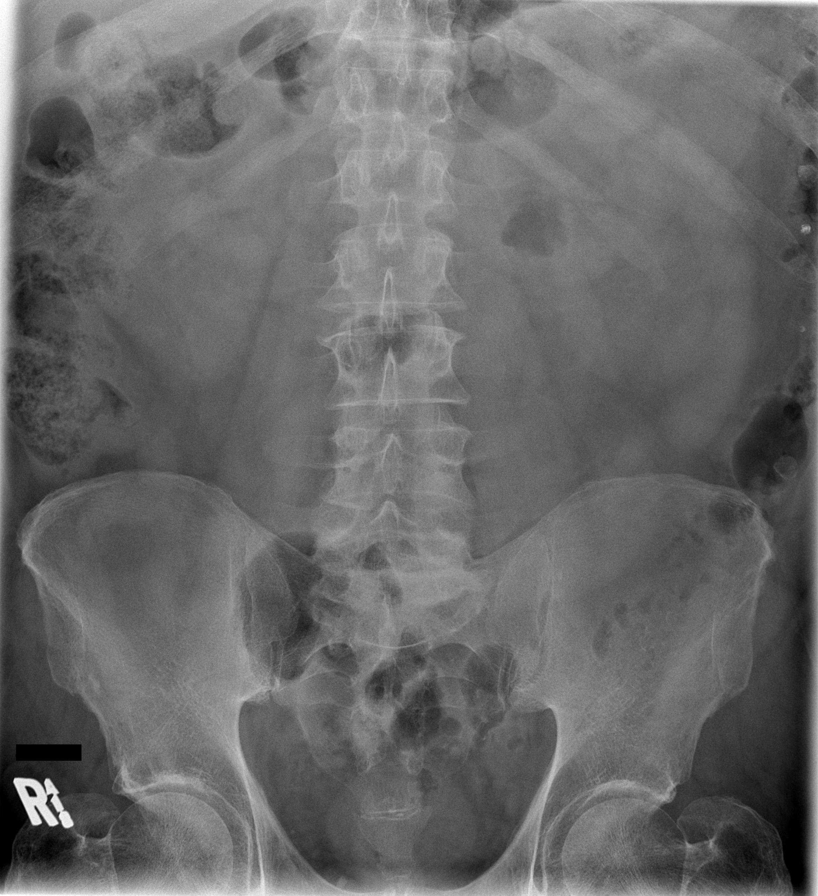

[Series 4: cp_standard · 0.34mm/px · 3 of 546 frames shown (1 of 6)]
[frame 82/546]
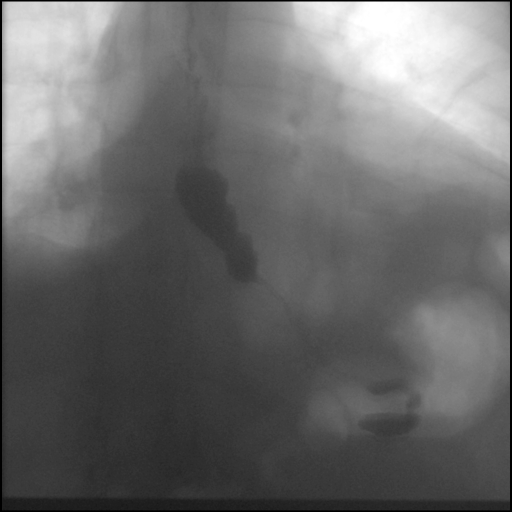
[frame 262/546]
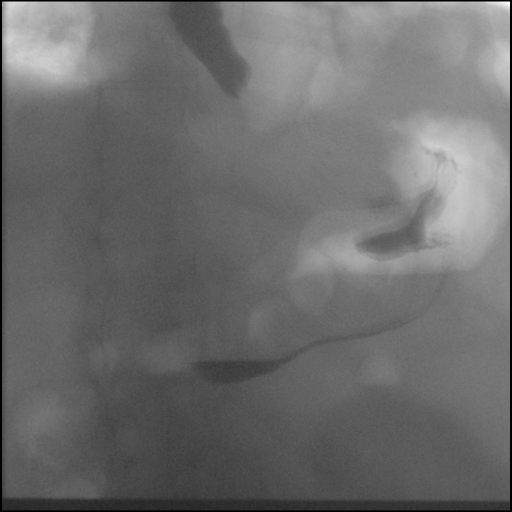
[frame 465/546]
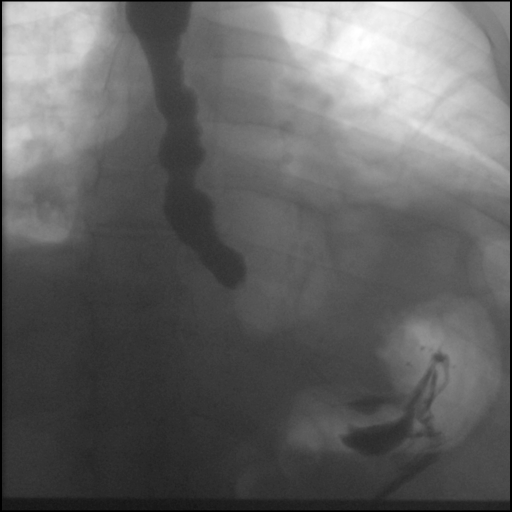

[Series 5: cp_standard · 0.26mm/px · 1 of 1 slices shown (2 of 6)]
[im 1/1]
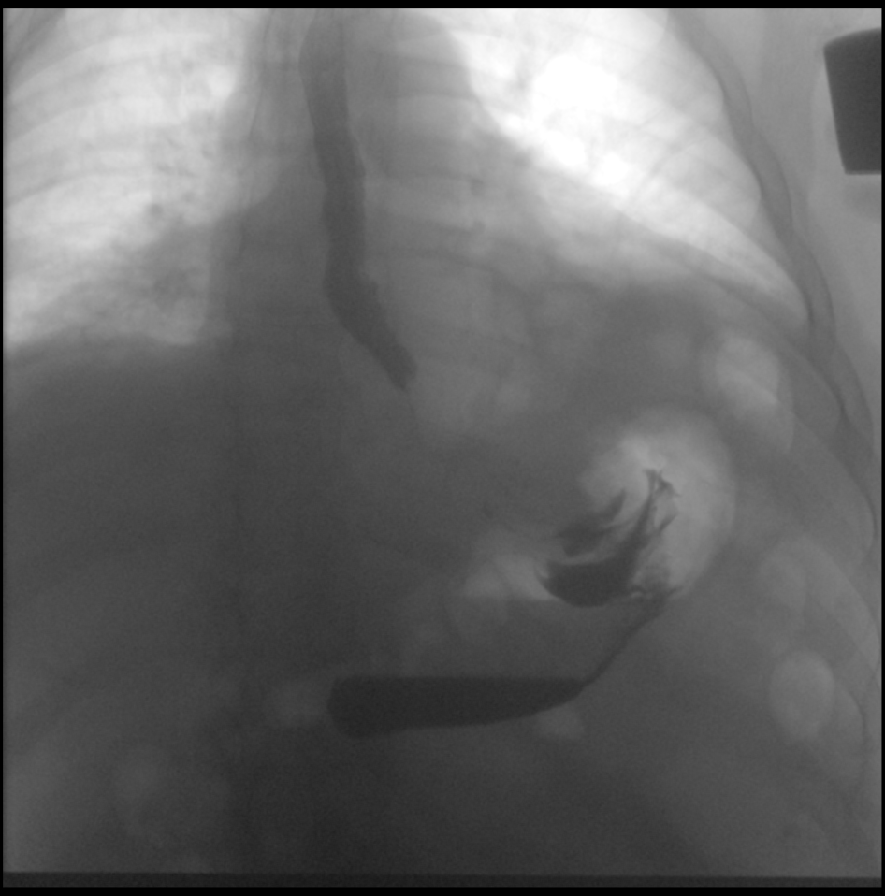

[Series 6: cp_standard · 0.51mm/px · 3 of 127 frames shown (3 of 6)]
[frame 20/127]
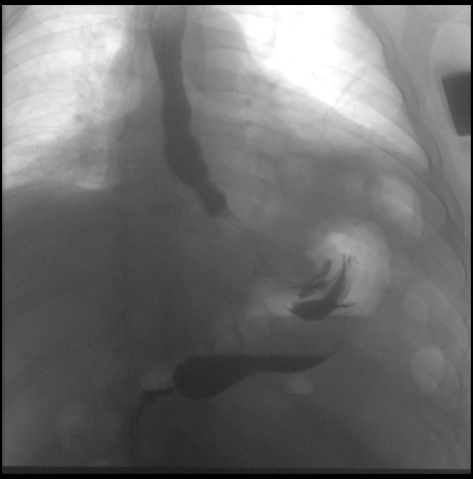
[frame 104/127]
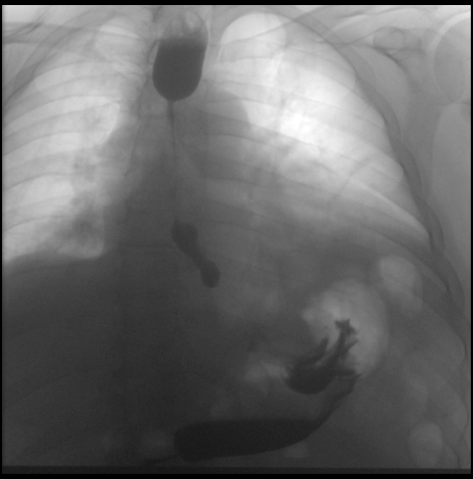
[frame 108/127]
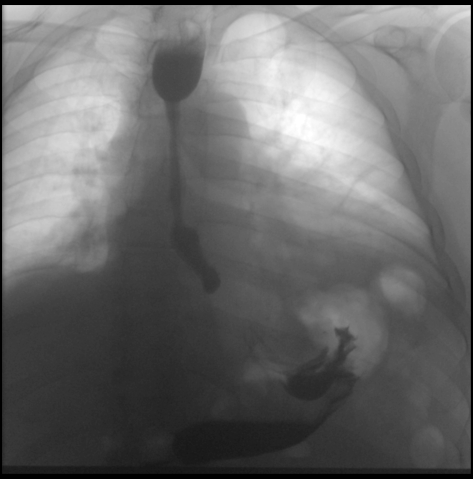

[Series 7: cp_standard · 0.51mm/px · 2 of 9 frames shown (4 of 6)]
[frame 5/9]
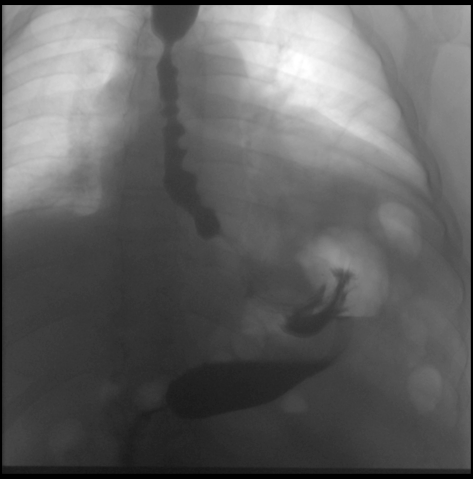
[frame 8/9]
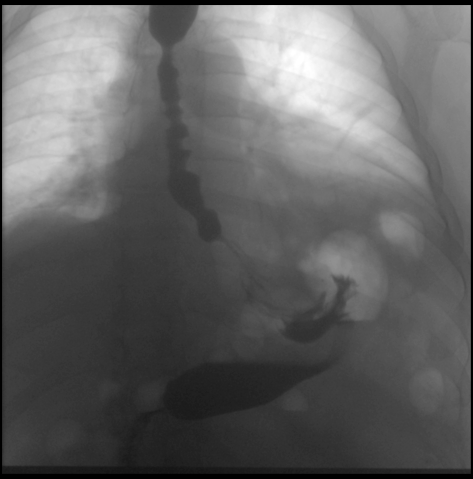

[Series 9: cp_standard · 0.52mm/px · 3 of 229 frames shown (5 of 6)]
[frame 35/229]
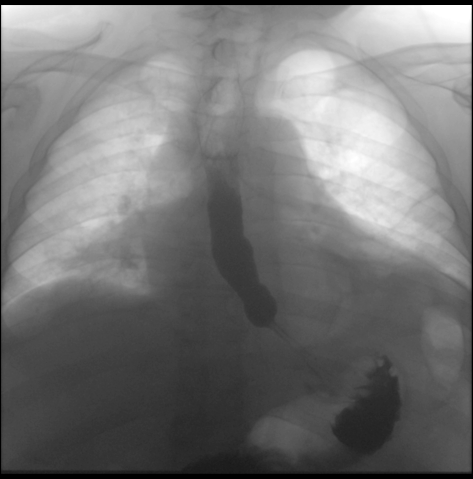
[frame 115/229]
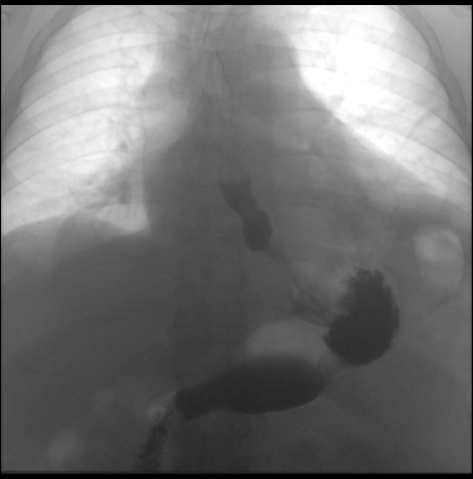
[frame 195/229]
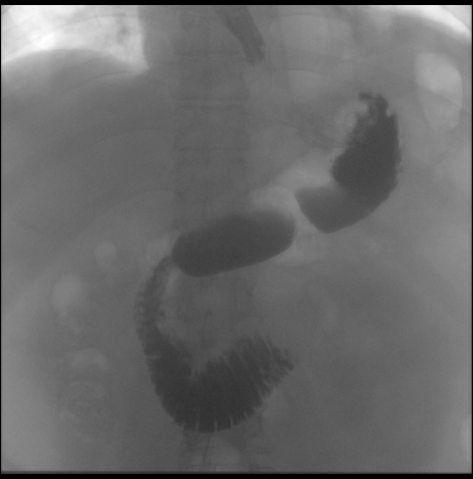

[Series 10: cp_standard · 0.26mm/px · 1 of 1 slices shown (6 of 6)]
[im 1/1]
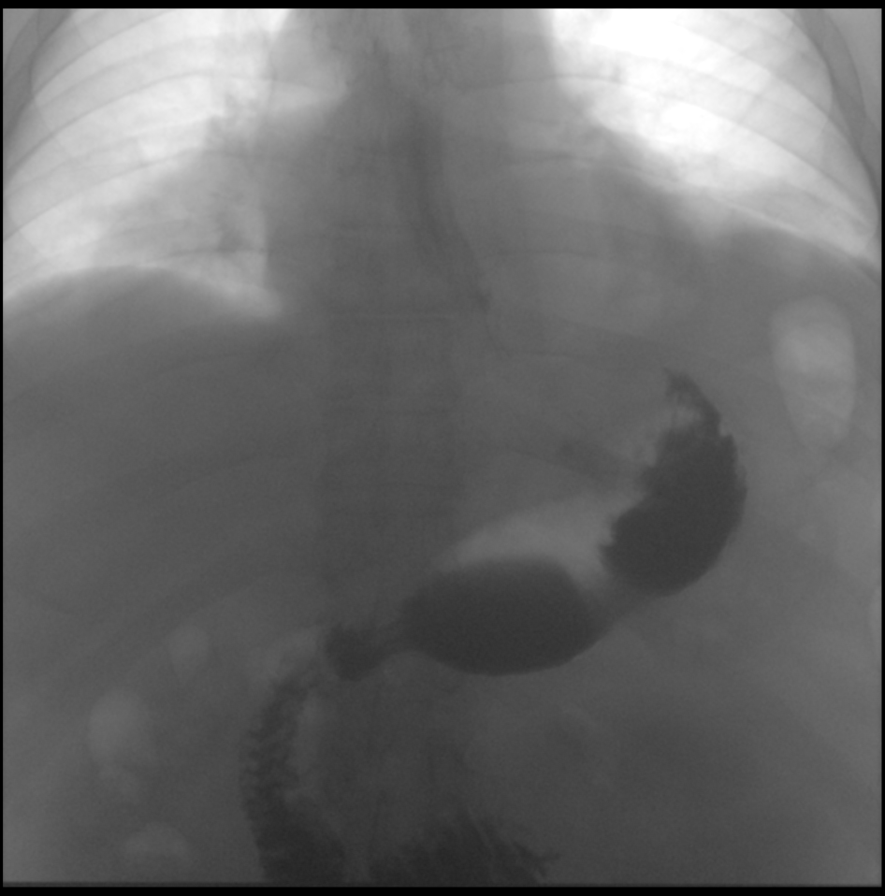

[14 of 20 positions shown; findings below may reference images not displayed]

FLUOROSCOPY TIME:  Fluoroscopy Time:  2 minutes

Radiation Exposure Index (if provided by the fluoroscopic device):
85.6 mGy

Number of Acquired Spot Images: 0
FINDINGS: Fluoroscopic evaluation of swallowing demonstrates stasis of
contrast within the esophagus with esophageal spasm and disruption
of primary esophageal waves. There is slow emptying of the contrast
through the fundoplication area. No evidence of leak. The stomach
empties promptly into the small bowel.
IMPRESSION: Mild delay of passage of contrast through the fundoplication area
with stasis in the esophagus and tertiary contractions. No evidence
of contrast leak.

## 2018-10-24 IMAGING — CT CT ANGIO CHEST
2 of 6 series · 18 of 36 positions shown · IV contrast (Omni 300)
Comparison: None.

CLINICAL DATA: Shortness of breath.  Recent DVT.

EXAM:
CT ANGIOGRAPHY CHEST WITH CONTRAST
TECHNIQUE: Multidetector CT imaging of the chest was performed using the
standard protocol during bolus administration of intravenous
contrast. Multiplanar CT image reconstructions and MIPs were
obtained to evaluate the vascular anatomy.
CONTRAST:  80 cc of Isovue 370

[Series 7: pe thins · axial · 0.69mm/px · z∈[+1305,+1522]mm · 17 of 245 slices shown]
[im 14/245  lung]
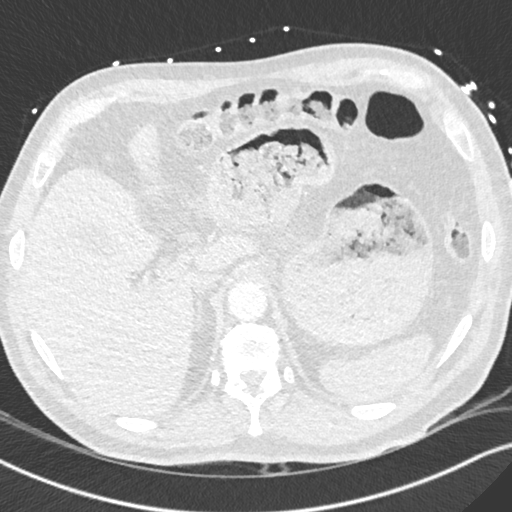
[im 28/245  mediastinal]
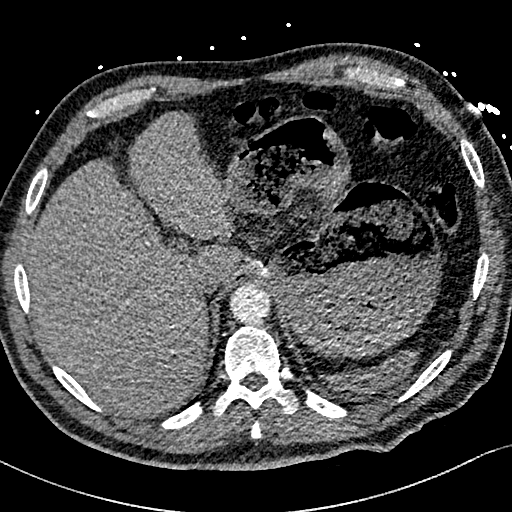
[im 41/245  lung]
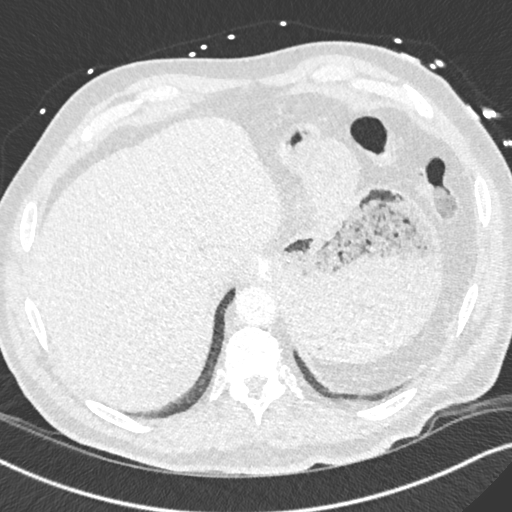
[im 55/245  mediastinal]
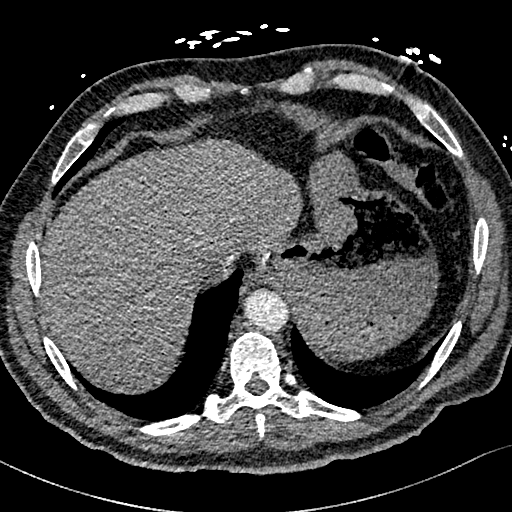
[im 68/245  lung]
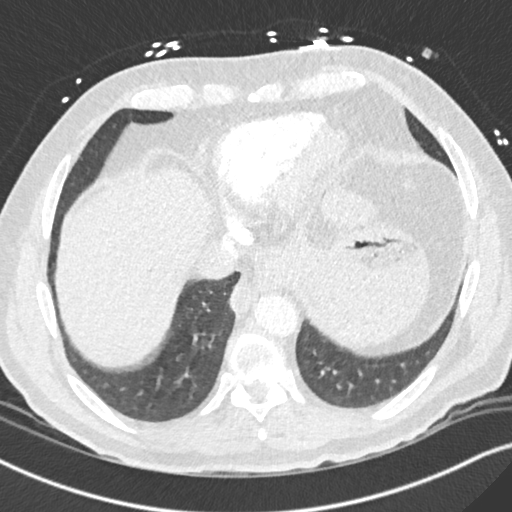
[im 82/245  mediastinal]
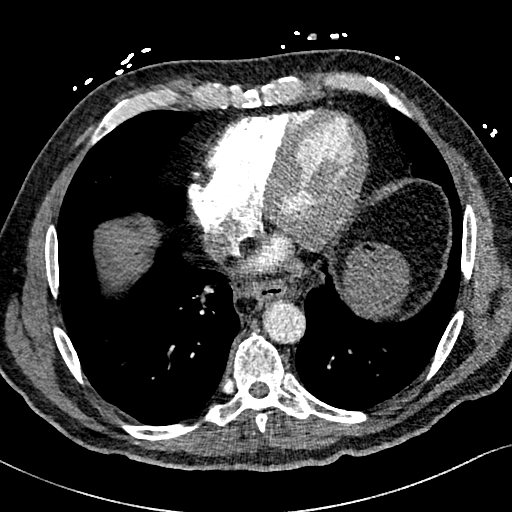
[im 95/245  lung]
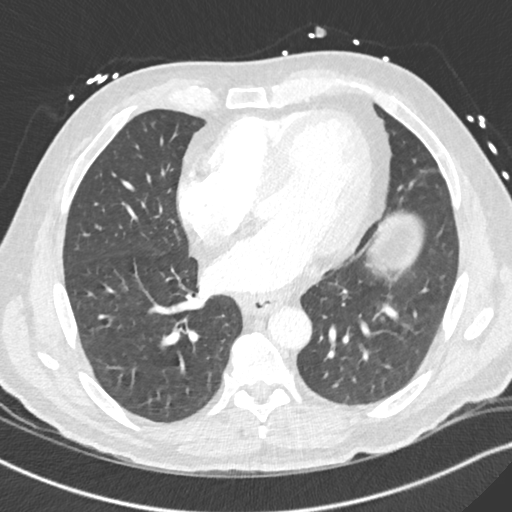
[im 109/245  mediastinal]
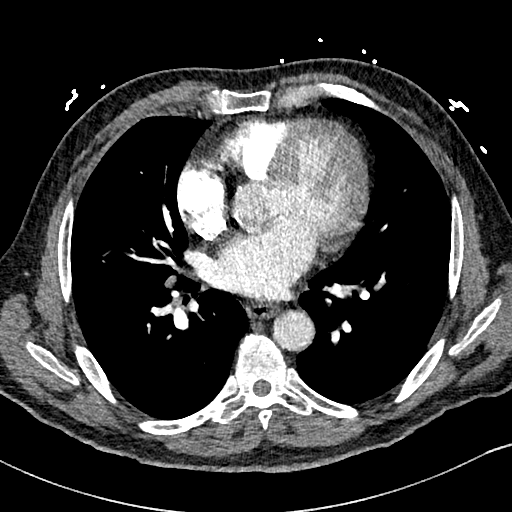
[im 123/245  lung]
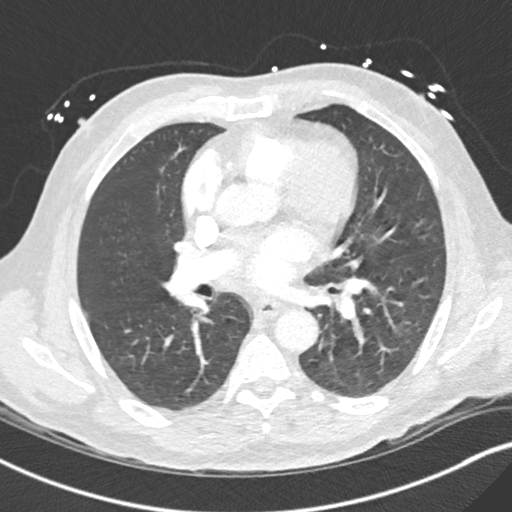
[im 136/245  mediastinal]
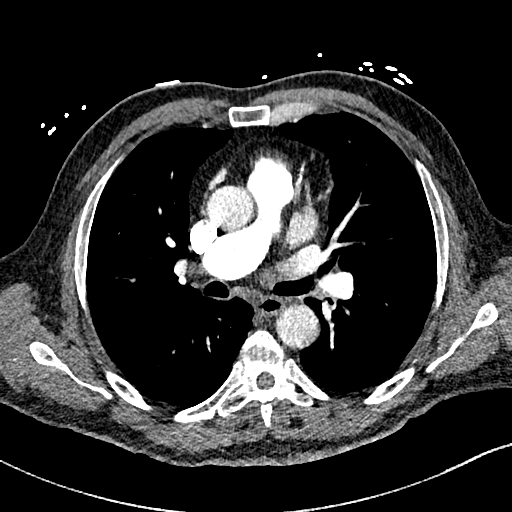
[im 150/245  lung]
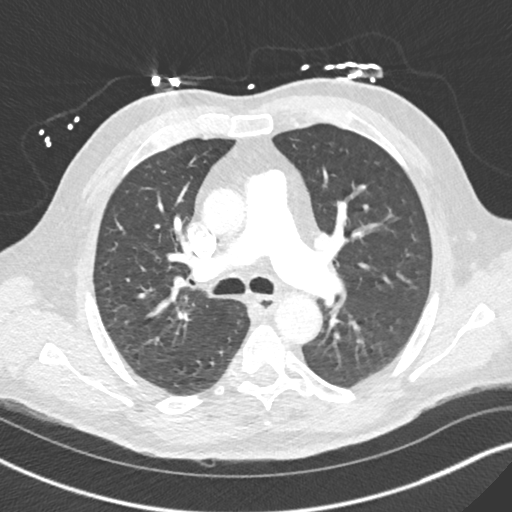
[im 163/245  mediastinal]
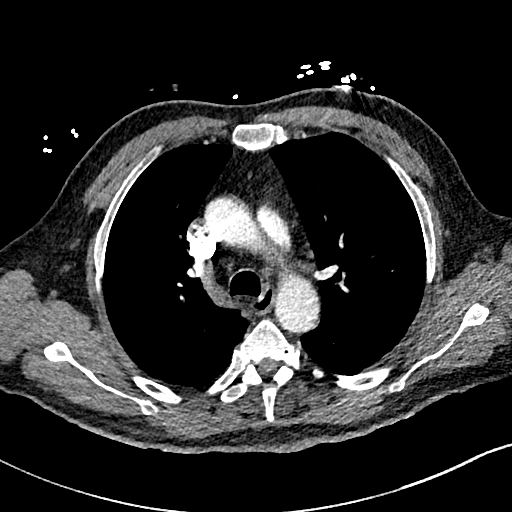
[im 177/245  lung]
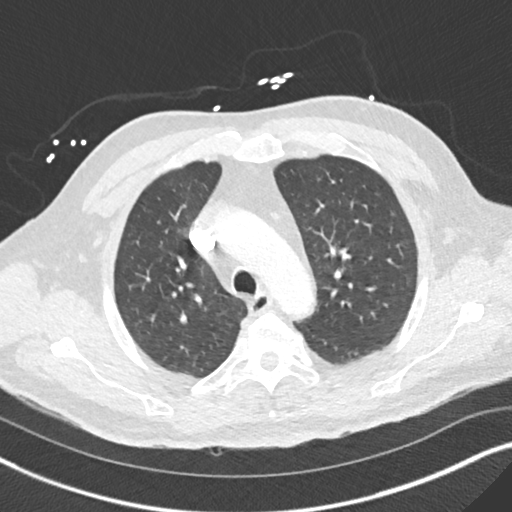
[im 190/245  mediastinal]
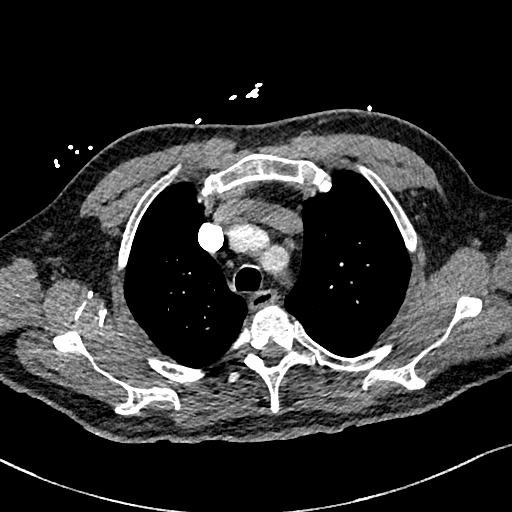
[im 204/245  lung]
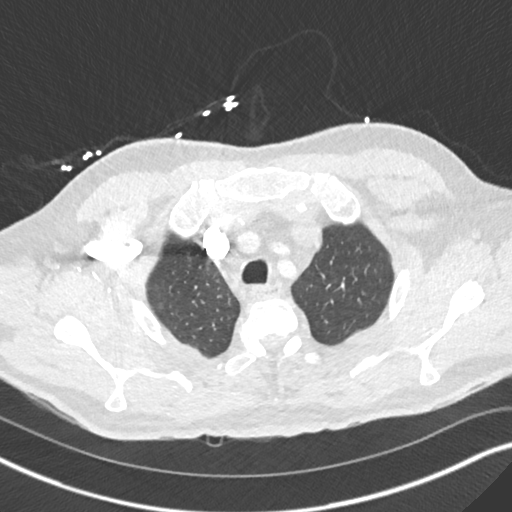
[im 217/245  mediastinal]
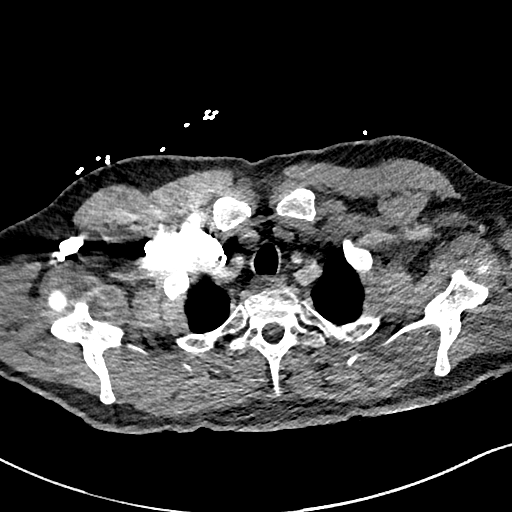
[im 231/245  lung]
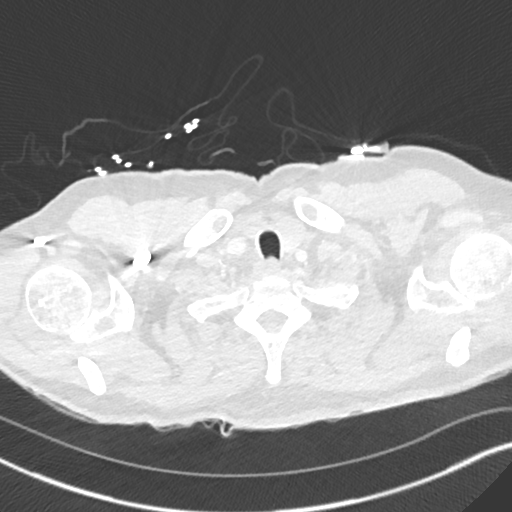

[Series 9: pe 2mm cor · coronal · 0.48mm/px · 1 of 151 slices shown]
[im 76/151  mediastinal]
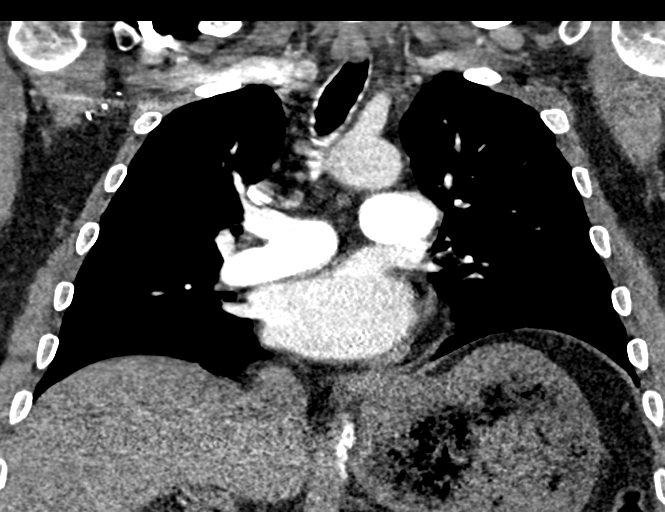

[18 of 36 positions shown; findings below may reference images not displayed]

FINDINGS: Cardiovascular: Normal heart size. Aortic atherosclerosis noted.
Calcification in the LAD coronary artery noted. The main pulmonary
artery appears normal. There is no lobar or segmental pulmonary
artery filling defects.

Mediastinum/Nodes: The trachea appears patent and is midline. Normal
appearance of the esophagus. No enlarged mediastinal or hilar
adenopathy.

Lungs/Pleura: There is no pleural effusion identified. No airspace
consolidation or atelectasis identified. Scar noted within the
lingula.

Upper Abdomen: No acute abnormality.

Musculoskeletal: No chest wall abnormality. No acute or significant
osseous findings.

Review of the MIP images confirms the above findings.
IMPRESSION: 1. No acute cardiopulmonary abnormalities and no evidence for acute
pulmonary embolus.
2. Aortic atherosclerosis and LAD coronary artery calcification.

## 2019-01-10 DIAGNOSIS — Z23 Encounter for immunization: Secondary | ICD-10-CM | POA: Diagnosis not present

## 2019-10-13 DIAGNOSIS — I10 Essential (primary) hypertension: Secondary | ICD-10-CM | POA: Diagnosis not present

## 2019-10-13 DIAGNOSIS — F5221 Male erectile disorder: Secondary | ICD-10-CM | POA: Diagnosis not present

## 2019-10-13 DIAGNOSIS — N401 Enlarged prostate with lower urinary tract symptoms: Secondary | ICD-10-CM | POA: Diagnosis not present

## 2019-10-13 DIAGNOSIS — B079 Viral wart, unspecified: Secondary | ICD-10-CM | POA: Diagnosis not present

## 2019-10-13 DIAGNOSIS — H9193 Unspecified hearing loss, bilateral: Secondary | ICD-10-CM | POA: Diagnosis not present

## 2019-10-13 DIAGNOSIS — Z125 Encounter for screening for malignant neoplasm of prostate: Secondary | ICD-10-CM | POA: Diagnosis not present

## 2019-10-13 DIAGNOSIS — Z0001 Encounter for general adult medical examination with abnormal findings: Secondary | ICD-10-CM | POA: Diagnosis not present

## 2019-10-13 DIAGNOSIS — Z23 Encounter for immunization: Secondary | ICD-10-CM | POA: Diagnosis not present

## 2019-10-13 DIAGNOSIS — Z789 Other specified health status: Secondary | ICD-10-CM | POA: Diagnosis not present

## 2019-10-13 DIAGNOSIS — D509 Iron deficiency anemia, unspecified: Secondary | ICD-10-CM | POA: Diagnosis not present

## 2019-10-13 DIAGNOSIS — E7800 Pure hypercholesterolemia, unspecified: Secondary | ICD-10-CM | POA: Diagnosis not present

## 2019-10-13 DIAGNOSIS — S50861S Insect bite (nonvenomous) of right forearm, sequela: Secondary | ICD-10-CM | POA: Diagnosis not present

## 2019-11-06 DIAGNOSIS — H903 Sensorineural hearing loss, bilateral: Secondary | ICD-10-CM | POA: Diagnosis not present

## 2019-11-06 DIAGNOSIS — Z570 Occupational exposure to noise: Secondary | ICD-10-CM | POA: Diagnosis not present

## 2019-11-06 DIAGNOSIS — H9313 Tinnitus, bilateral: Secondary | ICD-10-CM | POA: Diagnosis not present

## 2019-12-14 DIAGNOSIS — I10 Essential (primary) hypertension: Secondary | ICD-10-CM | POA: Diagnosis not present

## 2020-10-17 DIAGNOSIS — R21 Rash and other nonspecific skin eruption: Secondary | ICD-10-CM | POA: Diagnosis not present

## 2020-10-23 DIAGNOSIS — I10 Essential (primary) hypertension: Secondary | ICD-10-CM | POA: Diagnosis not present

## 2020-10-23 DIAGNOSIS — Z79899 Other long term (current) drug therapy: Secondary | ICD-10-CM | POA: Diagnosis not present

## 2020-10-23 DIAGNOSIS — N401 Enlarged prostate with lower urinary tract symptoms: Secondary | ICD-10-CM | POA: Diagnosis not present

## 2020-10-23 DIAGNOSIS — Z0001 Encounter for general adult medical examination with abnormal findings: Secondary | ICD-10-CM | POA: Diagnosis not present

## 2020-10-23 DIAGNOSIS — Z23 Encounter for immunization: Secondary | ICD-10-CM | POA: Diagnosis not present

## 2020-10-23 DIAGNOSIS — R21 Rash and other nonspecific skin eruption: Secondary | ICD-10-CM | POA: Diagnosis not present

## 2020-10-23 DIAGNOSIS — Z125 Encounter for screening for malignant neoplasm of prostate: Secondary | ICD-10-CM | POA: Diagnosis not present

## 2021-02-05 DIAGNOSIS — U071 COVID-19: Secondary | ICD-10-CM | POA: Diagnosis not present

## 2021-02-05 DIAGNOSIS — Z20822 Contact with and (suspected) exposure to covid-19: Secondary | ICD-10-CM | POA: Diagnosis not present

## 2021-07-08 DIAGNOSIS — H2512 Age-related nuclear cataract, left eye: Secondary | ICD-10-CM | POA: Diagnosis not present

## 2021-07-08 DIAGNOSIS — Z01818 Encounter for other preprocedural examination: Secondary | ICD-10-CM | POA: Diagnosis not present

## 2021-07-08 DIAGNOSIS — H2511 Age-related nuclear cataract, right eye: Secondary | ICD-10-CM | POA: Diagnosis not present

## 2021-07-16 DIAGNOSIS — H25811 Combined forms of age-related cataract, right eye: Secondary | ICD-10-CM | POA: Diagnosis not present

## 2021-07-16 DIAGNOSIS — H2511 Age-related nuclear cataract, right eye: Secondary | ICD-10-CM | POA: Diagnosis not present

## 2021-07-16 DIAGNOSIS — H2512 Age-related nuclear cataract, left eye: Secondary | ICD-10-CM | POA: Diagnosis not present

## 2021-08-06 DIAGNOSIS — H2512 Age-related nuclear cataract, left eye: Secondary | ICD-10-CM | POA: Diagnosis not present

## 2021-08-06 DIAGNOSIS — H25812 Combined forms of age-related cataract, left eye: Secondary | ICD-10-CM | POA: Diagnosis not present

## 2021-11-13 DIAGNOSIS — Z0001 Encounter for general adult medical examination with abnormal findings: Secondary | ICD-10-CM | POA: Diagnosis not present

## 2021-11-13 DIAGNOSIS — I10 Essential (primary) hypertension: Secondary | ICD-10-CM | POA: Diagnosis not present

## 2021-11-13 DIAGNOSIS — Z23 Encounter for immunization: Secondary | ICD-10-CM | POA: Diagnosis not present

## 2021-11-13 DIAGNOSIS — Z79899 Other long term (current) drug therapy: Secondary | ICD-10-CM | POA: Diagnosis not present

## 2021-11-13 DIAGNOSIS — Z125 Encounter for screening for malignant neoplasm of prostate: Secondary | ICD-10-CM | POA: Diagnosis not present

## 2021-11-13 DIAGNOSIS — D509 Iron deficiency anemia, unspecified: Secondary | ICD-10-CM | POA: Diagnosis not present

## 2021-11-13 DIAGNOSIS — N401 Enlarged prostate with lower urinary tract symptoms: Secondary | ICD-10-CM | POA: Diagnosis not present

## 2021-12-04 DIAGNOSIS — Z86010 Personal history of colonic polyps: Secondary | ICD-10-CM | POA: Diagnosis not present

## 2021-12-04 DIAGNOSIS — D175 Benign lipomatous neoplasm of intra-abdominal organs: Secondary | ICD-10-CM | POA: Diagnosis not present

## 2021-12-04 DIAGNOSIS — K5730 Diverticulosis of large intestine without perforation or abscess without bleeding: Secondary | ICD-10-CM | POA: Diagnosis not present

## 2021-12-04 DIAGNOSIS — D120 Benign neoplasm of cecum: Secondary | ICD-10-CM | POA: Diagnosis not present

## 2021-12-04 DIAGNOSIS — K648 Other hemorrhoids: Secondary | ICD-10-CM | POA: Diagnosis not present

## 2021-12-04 DIAGNOSIS — K621 Rectal polyp: Secondary | ICD-10-CM | POA: Diagnosis not present

## 2021-12-08 DIAGNOSIS — R3915 Urgency of urination: Secondary | ICD-10-CM | POA: Diagnosis not present

## 2021-12-08 DIAGNOSIS — N401 Enlarged prostate with lower urinary tract symptoms: Secondary | ICD-10-CM | POA: Diagnosis not present

## 2021-12-09 DIAGNOSIS — K621 Rectal polyp: Secondary | ICD-10-CM | POA: Diagnosis not present

## 2021-12-09 DIAGNOSIS — D120 Benign neoplasm of cecum: Secondary | ICD-10-CM | POA: Diagnosis not present

## 2022-01-30 DIAGNOSIS — M62838 Other muscle spasm: Secondary | ICD-10-CM | POA: Diagnosis not present

## 2022-01-30 DIAGNOSIS — M542 Cervicalgia: Secondary | ICD-10-CM | POA: Diagnosis not present

## 2022-01-31 DIAGNOSIS — M542 Cervicalgia: Secondary | ICD-10-CM | POA: Diagnosis not present

## 2022-01-31 DIAGNOSIS — M545 Low back pain, unspecified: Secondary | ICD-10-CM | POA: Diagnosis not present

## 2022-02-03 DIAGNOSIS — H5712 Ocular pain, left eye: Secondary | ICD-10-CM | POA: Diagnosis not present

## 2022-02-18 DIAGNOSIS — S161XXD Strain of muscle, fascia and tendon at neck level, subsequent encounter: Secondary | ICD-10-CM | POA: Diagnosis not present

## 2022-02-24 DIAGNOSIS — S161XXD Strain of muscle, fascia and tendon at neck level, subsequent encounter: Secondary | ICD-10-CM | POA: Diagnosis not present

## 2022-02-24 DIAGNOSIS — M542 Cervicalgia: Secondary | ICD-10-CM | POA: Diagnosis not present

## 2022-02-27 DIAGNOSIS — S161XXD Strain of muscle, fascia and tendon at neck level, subsequent encounter: Secondary | ICD-10-CM | POA: Diagnosis not present

## 2022-02-27 DIAGNOSIS — M542 Cervicalgia: Secondary | ICD-10-CM | POA: Diagnosis not present

## 2022-03-02 DIAGNOSIS — S161XXD Strain of muscle, fascia and tendon at neck level, subsequent encounter: Secondary | ICD-10-CM | POA: Diagnosis not present

## 2022-03-02 DIAGNOSIS — M542 Cervicalgia: Secondary | ICD-10-CM | POA: Diagnosis not present

## 2022-03-05 DIAGNOSIS — M542 Cervicalgia: Secondary | ICD-10-CM | POA: Diagnosis not present

## 2022-03-05 DIAGNOSIS — S161XXD Strain of muscle, fascia and tendon at neck level, subsequent encounter: Secondary | ICD-10-CM | POA: Diagnosis not present

## 2022-03-10 DIAGNOSIS — S161XXD Strain of muscle, fascia and tendon at neck level, subsequent encounter: Secondary | ICD-10-CM | POA: Diagnosis not present

## 2022-03-10 DIAGNOSIS — M542 Cervicalgia: Secondary | ICD-10-CM | POA: Diagnosis not present

## 2022-03-13 DIAGNOSIS — M542 Cervicalgia: Secondary | ICD-10-CM | POA: Diagnosis not present

## 2022-03-13 DIAGNOSIS — S161XXD Strain of muscle, fascia and tendon at neck level, subsequent encounter: Secondary | ICD-10-CM | POA: Diagnosis not present

## 2022-04-27 DIAGNOSIS — M542 Cervicalgia: Secondary | ICD-10-CM | POA: Diagnosis not present

## 2022-04-27 DIAGNOSIS — M62838 Other muscle spasm: Secondary | ICD-10-CM | POA: Diagnosis not present

## 2022-06-11 DIAGNOSIS — N401 Enlarged prostate with lower urinary tract symptoms: Secondary | ICD-10-CM | POA: Diagnosis not present

## 2022-06-11 DIAGNOSIS — N529 Male erectile dysfunction, unspecified: Secondary | ICD-10-CM | POA: Diagnosis not present

## 2022-06-29 DIAGNOSIS — H903 Sensorineural hearing loss, bilateral: Secondary | ICD-10-CM | POA: Diagnosis not present

## 2022-07-23 DIAGNOSIS — R3915 Urgency of urination: Secondary | ICD-10-CM | POA: Diagnosis not present

## 2022-07-23 DIAGNOSIS — N401 Enlarged prostate with lower urinary tract symptoms: Secondary | ICD-10-CM | POA: Diagnosis not present

## 2022-07-23 DIAGNOSIS — H903 Sensorineural hearing loss, bilateral: Secondary | ICD-10-CM | POA: Diagnosis not present

## 2022-07-23 DIAGNOSIS — N529 Male erectile dysfunction, unspecified: Secondary | ICD-10-CM | POA: Diagnosis not present

## 2022-09-04 DIAGNOSIS — H903 Sensorineural hearing loss, bilateral: Secondary | ICD-10-CM | POA: Diagnosis not present

## 2022-11-25 DIAGNOSIS — N401 Enlarged prostate with lower urinary tract symptoms: Secondary | ICD-10-CM | POA: Diagnosis not present

## 2022-11-25 DIAGNOSIS — I10 Essential (primary) hypertension: Secondary | ICD-10-CM | POA: Diagnosis not present

## 2022-11-25 DIAGNOSIS — Z125 Encounter for screening for malignant neoplasm of prostate: Secondary | ICD-10-CM | POA: Diagnosis not present

## 2022-11-25 DIAGNOSIS — Z0001 Encounter for general adult medical examination with abnormal findings: Secondary | ICD-10-CM | POA: Diagnosis not present

## 2022-11-25 DIAGNOSIS — Z79899 Other long term (current) drug therapy: Secondary | ICD-10-CM | POA: Diagnosis not present

## 2023-02-02 DIAGNOSIS — K5289 Other specified noninfective gastroenteritis and colitis: Secondary | ICD-10-CM | POA: Diagnosis not present

## 2023-02-02 DIAGNOSIS — D175 Benign lipomatous neoplasm of intra-abdominal organs: Secondary | ICD-10-CM | POA: Diagnosis not present

## 2023-02-02 DIAGNOSIS — Z9889 Other specified postprocedural states: Secondary | ICD-10-CM | POA: Diagnosis not present

## 2023-02-02 DIAGNOSIS — K5730 Diverticulosis of large intestine without perforation or abscess without bleeding: Secondary | ICD-10-CM | POA: Diagnosis not present

## 2023-02-02 DIAGNOSIS — D124 Benign neoplasm of descending colon: Secondary | ICD-10-CM | POA: Diagnosis not present

## 2023-02-02 DIAGNOSIS — Z09 Encounter for follow-up examination after completed treatment for conditions other than malignant neoplasm: Secondary | ICD-10-CM | POA: Diagnosis not present

## 2023-02-02 DIAGNOSIS — D123 Benign neoplasm of transverse colon: Secondary | ICD-10-CM | POA: Diagnosis not present

## 2023-02-02 DIAGNOSIS — Z86010 Personal history of colonic polyps: Secondary | ICD-10-CM | POA: Diagnosis not present

## 2023-02-02 DIAGNOSIS — D122 Benign neoplasm of ascending colon: Secondary | ICD-10-CM | POA: Diagnosis not present

## 2023-02-02 DIAGNOSIS — K648 Other hemorrhoids: Secondary | ICD-10-CM | POA: Diagnosis not present

## 2023-02-02 DIAGNOSIS — K644 Residual hemorrhoidal skin tags: Secondary | ICD-10-CM | POA: Diagnosis not present

## 2023-02-04 DIAGNOSIS — D124 Benign neoplasm of descending colon: Secondary | ICD-10-CM | POA: Diagnosis not present

## 2023-02-04 DIAGNOSIS — K5289 Other specified noninfective gastroenteritis and colitis: Secondary | ICD-10-CM | POA: Diagnosis not present

## 2023-02-04 DIAGNOSIS — D122 Benign neoplasm of ascending colon: Secondary | ICD-10-CM | POA: Diagnosis not present

## 2023-02-04 DIAGNOSIS — D123 Benign neoplasm of transverse colon: Secondary | ICD-10-CM | POA: Diagnosis not present

## 2023-07-02 DIAGNOSIS — N529 Male erectile dysfunction, unspecified: Secondary | ICD-10-CM | POA: Diagnosis not present

## 2023-07-02 DIAGNOSIS — R3915 Urgency of urination: Secondary | ICD-10-CM | POA: Diagnosis not present

## 2023-07-02 DIAGNOSIS — N401 Enlarged prostate with lower urinary tract symptoms: Secondary | ICD-10-CM | POA: Diagnosis not present

## 2023-12-07 DIAGNOSIS — Z79899 Other long term (current) drug therapy: Secondary | ICD-10-CM | POA: Diagnosis not present

## 2023-12-07 DIAGNOSIS — Z125 Encounter for screening for malignant neoplasm of prostate: Secondary | ICD-10-CM | POA: Diagnosis not present

## 2023-12-07 DIAGNOSIS — D509 Iron deficiency anemia, unspecified: Secondary | ICD-10-CM | POA: Diagnosis not present

## 2023-12-07 DIAGNOSIS — Z1331 Encounter for screening for depression: Secondary | ICD-10-CM | POA: Diagnosis not present

## 2023-12-07 DIAGNOSIS — N401 Enlarged prostate with lower urinary tract symptoms: Secondary | ICD-10-CM | POA: Diagnosis not present

## 2023-12-07 DIAGNOSIS — Z23 Encounter for immunization: Secondary | ICD-10-CM | POA: Diagnosis not present

## 2023-12-07 DIAGNOSIS — I10 Essential (primary) hypertension: Secondary | ICD-10-CM | POA: Diagnosis not present

## 2023-12-07 DIAGNOSIS — Z0001 Encounter for general adult medical examination with abnormal findings: Secondary | ICD-10-CM | POA: Diagnosis not present

## 2023-12-15 DIAGNOSIS — H34832 Tributary (branch) retinal vein occlusion, left eye, with macular edema: Secondary | ICD-10-CM | POA: Diagnosis not present

## 2024-02-29 DIAGNOSIS — R059 Cough, unspecified: Secondary | ICD-10-CM | POA: Diagnosis not present

## 2024-02-29 DIAGNOSIS — Z79899 Other long term (current) drug therapy: Secondary | ICD-10-CM | POA: Diagnosis not present

## 2024-02-29 DIAGNOSIS — I10 Essential (primary) hypertension: Secondary | ICD-10-CM | POA: Diagnosis not present

## 2024-03-14 DIAGNOSIS — D225 Melanocytic nevi of trunk: Secondary | ICD-10-CM | POA: Diagnosis not present

## 2024-03-14 DIAGNOSIS — L821 Other seborrheic keratosis: Secondary | ICD-10-CM | POA: Diagnosis not present

## 2024-03-14 DIAGNOSIS — I788 Other diseases of capillaries: Secondary | ICD-10-CM | POA: Diagnosis not present

## 2024-03-14 DIAGNOSIS — L814 Other melanin hyperpigmentation: Secondary | ICD-10-CM | POA: Diagnosis not present

## 2024-06-05 DIAGNOSIS — Z79899 Other long term (current) drug therapy: Secondary | ICD-10-CM | POA: Diagnosis not present

## 2024-06-05 DIAGNOSIS — M79601 Pain in right arm: Secondary | ICD-10-CM | POA: Diagnosis not present

## 2024-06-05 DIAGNOSIS — R059 Cough, unspecified: Secondary | ICD-10-CM | POA: Diagnosis not present

## 2024-06-05 DIAGNOSIS — I10 Essential (primary) hypertension: Secondary | ICD-10-CM | POA: Diagnosis not present

## 2024-06-15 DIAGNOSIS — H34832 Tributary (branch) retinal vein occlusion, left eye, with macular edema: Secondary | ICD-10-CM | POA: Diagnosis not present

## 2024-09-12 DIAGNOSIS — S20479A Other superficial bite of unspecified back wall of thorax, initial encounter: Secondary | ICD-10-CM | POA: Diagnosis not present

## 2024-09-12 DIAGNOSIS — S90561A Insect bite (nonvenomous), right ankle, initial encounter: Secondary | ICD-10-CM | POA: Diagnosis not present

## 2024-09-12 DIAGNOSIS — S40269A Insect bite (nonvenomous) of unspecified shoulder, initial encounter: Secondary | ICD-10-CM | POA: Diagnosis not present

## 2024-09-12 DIAGNOSIS — I10 Essential (primary) hypertension: Secondary | ICD-10-CM | POA: Diagnosis not present

## 2024-09-12 DIAGNOSIS — W57XXXA Bitten or stung by nonvenomous insect and other nonvenomous arthropods, initial encounter: Secondary | ICD-10-CM | POA: Diagnosis not present

## 2024-09-12 DIAGNOSIS — R011 Cardiac murmur, unspecified: Secondary | ICD-10-CM | POA: Diagnosis not present
